# Patient Record
Sex: Male | Born: 1955 | Race: White | Hispanic: No | Marital: Married | Smoking: Current every day smoker
Health system: Southern US, Community
[De-identification: ages and names within clinical notes are randomized; demographics above are authoritative.]

## PROBLEM LIST (undated history)

## (undated) DIAGNOSIS — I10 Essential (primary) hypertension: Secondary | ICD-10-CM

## (undated) DIAGNOSIS — F419 Anxiety disorder, unspecified: Secondary | ICD-10-CM

## (undated) DIAGNOSIS — E119 Type 2 diabetes mellitus without complications: Secondary | ICD-10-CM

## (undated) DIAGNOSIS — J449 Chronic obstructive pulmonary disease, unspecified: Secondary | ICD-10-CM

## (undated) DIAGNOSIS — I82409 Acute embolism and thrombosis of unspecified deep veins of unspecified lower extremity: Secondary | ICD-10-CM

## (undated) DIAGNOSIS — Z951 Presence of aortocoronary bypass graft: Secondary | ICD-10-CM

## (undated) DIAGNOSIS — I2510 Atherosclerotic heart disease of native coronary artery without angina pectoris: Secondary | ICD-10-CM

## (undated) DIAGNOSIS — K269 Duodenal ulcer, unspecified as acute or chronic, without hemorrhage or perforation: Secondary | ICD-10-CM

## (undated) HISTORY — PX: CORONARY ARTERY BYPASS GRAFT: SHX141

---

## 2019-02-11 ENCOUNTER — Inpatient Hospital Stay (HOSPITAL_COMMUNITY): Payer: 59

## 2019-02-11 ENCOUNTER — Other Ambulatory Visit: Payer: Self-pay

## 2019-02-11 ENCOUNTER — Encounter (HOSPITAL_COMMUNITY): Payer: Self-pay | Admitting: Cardiothoracic Surgery

## 2019-02-11 ENCOUNTER — Inpatient Hospital Stay (HOSPITAL_COMMUNITY)
Admission: EM | Admit: 2019-02-11 | Discharge: 2019-02-19 | DRG: 235 | Disposition: A | Discharge status: Home Health | Payer: 59 | Source: Other Acute Inpatient Hospital | Attending: Cardiothoracic Surgery | Admitting: Cardiothoracic Surgery

## 2019-02-11 ENCOUNTER — Inpatient Hospital Stay (HOSPITAL_COMMUNITY)
Admission: EM | Disposition: A | Discharge status: Home Health | Payer: Self-pay | Source: Other Acute Inpatient Hospital | Attending: Cardiothoracic Surgery

## 2019-02-11 ENCOUNTER — Inpatient Hospital Stay (HOSPITAL_COMMUNITY): Payer: 59 | Admitting: Certified Registered Nurse Anesthetist

## 2019-02-11 ENCOUNTER — Emergency Department
Admission: EM | Admit: 2019-02-11 | Discharge: 2019-02-11 | Disposition: A | Discharge status: Other Acute Inpatient Hospital | Payer: 59 | Attending: Emergency Medicine | Admitting: Emergency Medicine

## 2019-02-11 ENCOUNTER — Emergency Department: Payer: 59

## 2019-02-11 DIAGNOSIS — T82898A Other specified complication of vascular prosthetic devices, implants and grafts, initial encounter: Secondary | ICD-10-CM | POA: Diagnosis present

## 2019-02-11 DIAGNOSIS — Z720 Tobacco use: Secondary | ICD-10-CM

## 2019-02-11 DIAGNOSIS — F411 Generalized anxiety disorder: Secondary | ICD-10-CM

## 2019-02-11 DIAGNOSIS — I10 Essential (primary) hypertension: Secondary | ICD-10-CM | POA: Insufficient documentation

## 2019-02-11 DIAGNOSIS — I219 Acute myocardial infarction, unspecified: Secondary | ICD-10-CM

## 2019-02-11 DIAGNOSIS — J449 Chronic obstructive pulmonary disease, unspecified: Secondary | ICD-10-CM

## 2019-02-11 DIAGNOSIS — K269 Duodenal ulcer, unspecified as acute or chronic, without hemorrhage or perforation: Secondary | ICD-10-CM | POA: Diagnosis present

## 2019-02-11 DIAGNOSIS — Z951 Presence of aortocoronary bypass graft: Secondary | ICD-10-CM

## 2019-02-11 DIAGNOSIS — Z91012 Allergy to eggs: Secondary | ICD-10-CM

## 2019-02-11 DIAGNOSIS — Z86718 Personal history of other venous thrombosis and embolism: Secondary | ICD-10-CM

## 2019-02-11 DIAGNOSIS — Z7984 Long term (current) use of oral hypoglycemic drugs: Secondary | ICD-10-CM

## 2019-02-11 DIAGNOSIS — J9622 Acute and chronic respiratory failure with hypercapnia: Secondary | ICD-10-CM | POA: Diagnosis not present

## 2019-02-11 DIAGNOSIS — I253 Aneurysm of heart: Secondary | ICD-10-CM | POA: Diagnosis present

## 2019-02-11 DIAGNOSIS — E876 Hypokalemia: Secondary | ICD-10-CM | POA: Diagnosis not present

## 2019-02-11 DIAGNOSIS — J9811 Atelectasis: Secondary | ICD-10-CM | POA: Diagnosis not present

## 2019-02-11 DIAGNOSIS — T82897A Other specified complication of cardiac prosthetic devices, implants and grafts, initial encounter: Secondary | ICD-10-CM

## 2019-02-11 DIAGNOSIS — R072 Precordial pain: Secondary | ICD-10-CM | POA: Insufficient documentation

## 2019-02-11 DIAGNOSIS — Z79899 Other long term (current) drug therapy: Secondary | ICD-10-CM | POA: Diagnosis not present

## 2019-02-11 DIAGNOSIS — I82409 Acute embolism and thrombosis of unspecified deep veins of unspecified lower extremity: Secondary | ICD-10-CM

## 2019-02-11 DIAGNOSIS — Z91011 Allergy to milk products: Secondary | ICD-10-CM

## 2019-02-11 DIAGNOSIS — E119 Type 2 diabetes mellitus without complications: Secondary | ICD-10-CM | POA: Diagnosis present

## 2019-02-11 DIAGNOSIS — I772 Rupture of artery: Secondary | ICD-10-CM | POA: Insufficient documentation

## 2019-02-11 DIAGNOSIS — Z09 Encounter for follow-up examination after completed treatment for conditions other than malignant neoplasm: Secondary | ICD-10-CM

## 2019-02-11 DIAGNOSIS — E669 Obesity, unspecified: Secondary | ICD-10-CM | POA: Diagnosis present

## 2019-02-11 DIAGNOSIS — L03313 Cellulitis of chest wall: Secondary | ICD-10-CM | POA: Diagnosis not present

## 2019-02-11 DIAGNOSIS — R0789 Other chest pain: Secondary | ICD-10-CM | POA: Diagnosis present

## 2019-02-11 DIAGNOSIS — Z880 Allergy status to penicillin: Secondary | ICD-10-CM | POA: Diagnosis not present

## 2019-02-11 DIAGNOSIS — Z6834 Body mass index (BMI) 34.0-34.9, adult: Secondary | ICD-10-CM | POA: Diagnosis not present

## 2019-02-11 DIAGNOSIS — R079 Chest pain, unspecified: Secondary | ICD-10-CM

## 2019-02-11 DIAGNOSIS — Z20822 Contact with and (suspected) exposure to covid-19: Secondary | ICD-10-CM | POA: Insufficient documentation

## 2019-02-11 DIAGNOSIS — E8770 Fluid overload, unspecified: Secondary | ICD-10-CM | POA: Diagnosis not present

## 2019-02-11 DIAGNOSIS — T8149XA Infection following a procedure, other surgical site, initial encounter: Secondary | ICD-10-CM | POA: Diagnosis not present

## 2019-02-11 DIAGNOSIS — K219 Gastro-esophageal reflux disease without esophagitis: Secondary | ICD-10-CM | POA: Diagnosis present

## 2019-02-11 DIAGNOSIS — Z95828 Presence of other vascular implants and grafts: Secondary | ICD-10-CM

## 2019-02-11 DIAGNOSIS — F17210 Nicotine dependence, cigarettes, uncomplicated: Secondary | ICD-10-CM | POA: Diagnosis present

## 2019-02-11 DIAGNOSIS — I4891 Unspecified atrial fibrillation: Secondary | ICD-10-CM | POA: Diagnosis not present

## 2019-02-11 DIAGNOSIS — I714 Abdominal aortic aneurysm, without rupture: Secondary | ICD-10-CM

## 2019-02-11 DIAGNOSIS — I2510 Atherosclerotic heart disease of native coronary artery without angina pectoris: Secondary | ICD-10-CM

## 2019-02-11 DIAGNOSIS — Z419 Encounter for procedure for purposes other than remedying health state, unspecified: Secondary | ICD-10-CM

## 2019-02-11 HISTORY — DX: Duodenal ulcer, unspecified as acute or chronic, without hemorrhage or perforation: K26.9

## 2019-02-11 HISTORY — DX: Presence of aortocoronary bypass graft: Z95.1

## 2019-02-11 HISTORY — DX: Type 2 diabetes mellitus without complications: E11.9

## 2019-02-11 HISTORY — DX: Essential (primary) hypertension: I10

## 2019-02-11 HISTORY — DX: Anxiety disorder, unspecified: F41.9

## 2019-02-11 HISTORY — DX: Chronic obstructive pulmonary disease, unspecified: J44.9

## 2019-02-11 HISTORY — DX: Acute embolism and thrombosis of unspecified deep veins of unspecified lower extremity: I82.409

## 2019-02-11 HISTORY — PX: THORACIC AORTIC ANEURYSM REPAIR: SHX799

## 2019-02-11 HISTORY — PX: TEE WITHOUT CARDIOVERSION: SHX5443

## 2019-02-11 HISTORY — DX: Atherosclerotic heart disease of native coronary artery without angina pectoris: I25.10

## 2019-02-11 HISTORY — PX: CORONARY ARTERY BYPASS GRAFT: SHX141

## 2019-02-11 LAB — PROTIME-INR
INR: 0.9 (ref 0.8–1.2)
Prothrombin Time: 11.9 seconds (ref 11.4–15.2)

## 2019-02-11 LAB — CBC WITH DIFFERENTIAL/PLATELET
Abs Immature Granulocytes: 0.21 10*3/uL — ABNORMAL HIGH (ref 0.00–0.07)
Basophils Absolute: 0.1 10*3/uL (ref 0.0–0.1)
Basophils Relative: 1 %
Eosinophils Absolute: 0.4 10*3/uL (ref 0.0–0.5)
Eosinophils Relative: 3 %
HCT: 47.6 % (ref 39.0–52.0)
Hemoglobin: 15.3 g/dL (ref 13.0–17.0)
Immature Granulocytes: 2 %
Lymphocytes Relative: 29 %
Lymphs Abs: 3.3 10*3/uL (ref 0.7–4.0)
MCH: 30.7 pg (ref 26.0–34.0)
MCHC: 32.1 g/dL (ref 30.0–36.0)
MCV: 95.6 fL (ref 80.0–100.0)
Monocytes Absolute: 1 10*3/uL (ref 0.1–1.0)
Monocytes Relative: 9 %
Neutro Abs: 6.3 10*3/uL (ref 1.7–7.7)
Neutrophils Relative %: 56 %
Platelets: 256 10*3/uL (ref 150–400)
RBC: 4.98 MIL/uL (ref 4.22–5.81)
RDW: 13 % (ref 11.5–15.5)
WBC: 11.4 10*3/uL — ABNORMAL HIGH (ref 4.0–10.5)
nRBC: 0 % (ref 0.0–0.2)

## 2019-02-11 LAB — COMPREHENSIVE METABOLIC PANEL
ALT: 24 U/L (ref 0–44)
AST: 25 U/L (ref 15–41)
Albumin: 4.4 g/dL (ref 3.5–5.0)
Alkaline Phosphatase: 72 U/L (ref 38–126)
Anion gap: 13 (ref 5–15)
BUN: 14 mg/dL (ref 8–23)
CO2: 24 mmol/L (ref 22–32)
Calcium: 8.8 mg/dL — ABNORMAL LOW (ref 8.9–10.3)
Chloride: 104 mmol/L (ref 98–111)
Creatinine, Ser: 1.08 mg/dL (ref 0.61–1.24)
GFR calc Af Amer: >60 mL/min (ref >60)
GFR calc non Af Amer: >60 mL/min (ref >60)
Glucose, Bld: 238 mg/dL — ABNORMAL HIGH (ref 70–99)
Potassium: 4.5 mmol/L (ref 3.5–5.1)
Sodium: 141 mmol/L (ref 135–145)
Total Bilirubin: 0.6 mg/dL (ref 0.3–1.2)
Total Protein: 7.7 g/dL (ref 6.5–8.1)

## 2019-02-11 LAB — GLUCOSE, CAPILLARY
Glucose-Capillary: 174 mg/dL — ABNORMAL HIGH (ref 70–99)
Glucose-Capillary: 136 mg/dL — ABNORMAL HIGH (ref 70–99)

## 2019-02-11 LAB — SURGICAL PCR SCREEN
MRSA, PCR: NEGATIVE
Staphylococcus aureus: POSITIVE — AB

## 2019-02-11 LAB — PREPARE RBC (CROSSMATCH)

## 2019-02-11 LAB — TROPONIN I (HIGH SENSITIVITY)
Troponin I (High Sensitivity): 21 ng/L — ABNORMAL HIGH (ref <18)
Troponin I (High Sensitivity): 210 ng/L (ref <18)

## 2019-02-11 LAB — RESPIRATORY PANEL BY RT PCR (FLU A&B, COVID)
Influenza A by PCR: NEGATIVE
Influenza B by PCR: NEGATIVE
SARS Coronavirus 2 by RT PCR: NEGATIVE

## 2019-02-11 LAB — ABO/RH: ABO/RH(D): A POS

## 2019-02-11 LAB — APTT: aPTT: 28 seconds (ref 24–36)

## 2019-02-11 SURGERY — REDO STERNOTOMY
Anesthesia: General | Site: Chest

## 2019-02-11 MED ORDER — NOREPINEPHRINE 4 MG/250ML-% IV SOLN
0.0000 ug/min | INTRAVENOUS | Status: AC
  Administered 2019-02-12: 01:00:00 20 ug/min via INTRAVENOUS
  Filled 2019-02-11: qty 250

## 2019-02-11 MED ORDER — HEMOSTATIC AGENTS (NO CHARGE) OPTIME
TOPICAL | Status: DC | PRN
  Administered 2019-02-11 (×5): 1 via TOPICAL

## 2019-02-11 MED ORDER — LIDOCAINE 2% (20 MG/ML) 5 ML SYRINGE
INTRAMUSCULAR | Status: DC | PRN
  Administered 2019-02-11: 60 mg via INTRAVENOUS

## 2019-02-11 MED ORDER — SODIUM CHLORIDE 0.9 % IV BOLUS
500.0000 mL | Freq: Once | INTRAVENOUS | Status: AC
  Administered 2019-02-11: 500 mL via INTRAVENOUS

## 2019-02-11 MED ORDER — DEXMEDETOMIDINE HCL IN NACL 400 MCG/100ML IV SOLN
0.1000 ug/kg/h | INTRAVENOUS | Status: DC
  Filled 2019-02-11: qty 100

## 2019-02-11 MED ORDER — IOHEXOL 350 MG/ML SOLN: 125.0000 mL | Freq: Once | INTRAVENOUS | Status: DC | PRN

## 2019-02-11 MED ORDER — NITROGLYCERIN 0.4 MG SL SUBL
0.4000 mg | SUBLINGUAL_TABLET | SUBLINGUAL | Status: DC | PRN
  Administered 2019-02-11 (×3): 0.4 mg via SUBLINGUAL
  Filled 2019-02-11: qty 1

## 2019-02-11 MED ORDER — INSULIN REGULAR(HUMAN) IN NACL 100-0.9 UT/100ML-% IV SOLN
INTRAVENOUS | Status: DC
  Filled 2019-02-11 (×2): qty 100

## 2019-02-11 MED ORDER — PROPOFOL 10 MG/ML IV BOLUS
INTRAVENOUS | Status: DC | PRN
  Administered 2019-02-11: 50 mg via INTRAVENOUS

## 2019-02-11 MED ORDER — TRANEXAMIC ACID 1000 MG/10ML IV SOLN
1.5000 mg/kg/h | INTRAVENOUS | Status: DC
  Filled 2019-02-11 (×2): qty 25

## 2019-02-11 MED ORDER — NITROGLYCERIN IN D5W 200-5 MCG/ML-% IV SOLN
INTRAVENOUS | Status: AC
  Filled 2019-02-11: qty 250

## 2019-02-11 MED ORDER — FENTANYL CITRATE (PF) 250 MCG/5ML IJ SOLN
INTRAMUSCULAR | Status: AC
  Filled 2019-02-11: qty 10

## 2019-02-11 MED ORDER — FENTANYL CITRATE (PF) 100 MCG/2ML IJ SOLN
50.0000 ug | Freq: Once | INTRAMUSCULAR | Status: AC
  Administered 2019-02-11: 50 ug via INTRAVENOUS
  Filled 2019-02-11: qty 2

## 2019-02-11 MED ORDER — TRANEXAMIC ACID (OHS) PUMP PRIME SOLUTION
2.0000 mg/kg | INTRAVENOUS | Status: DC
  Filled 2019-02-11: qty 2.3

## 2019-02-11 MED ORDER — PROTAMINE SULFATE 10 MG/ML IV SOLN
40.0000 mg | Freq: Once | INTRAVENOUS | Status: AC
  Administered 2019-02-11: 40 mg via INTRAVENOUS
  Filled 2019-02-11: qty 4

## 2019-02-11 MED ORDER — INSULIN REGULAR(HUMAN) IN NACL 100-0.9 UT/100ML-% IV SOLN
INTRAVENOUS | Status: DC | PRN
  Administered 2019-02-11: 7.5 [IU]/h via INTRAVENOUS

## 2019-02-11 MED ORDER — PHENYLEPHRINE 40 MCG/ML (10ML) SYRINGE FOR IV PUSH (FOR BLOOD PRESSURE SUPPORT)
PREFILLED_SYRINGE | INTRAVENOUS | Status: AC
  Filled 2019-02-11: qty 10

## 2019-02-11 MED ORDER — MIDAZOLAM HCL 2 MG/2ML IJ SOLN
INTRAMUSCULAR | Status: AC
  Filled 2019-02-11: qty 2

## 2019-02-11 MED ORDER — DEXTROSE 50 % IV SOLN
INTRAVENOUS | Status: DC | PRN
  Administered 2019-02-11 – 2019-02-12 (×2): 25 g via INTRAVENOUS

## 2019-02-11 MED ORDER — VANCOMYCIN HCL 1500 MG/300ML IV SOLN
1500.0000 mg | INTRAVENOUS | Status: AC
  Administered 2019-02-11: 1500 mg via INTRAVENOUS
  Filled 2019-02-11: qty 300

## 2019-02-11 MED ORDER — TRANEXAMIC ACID (OHS) BOLUS VIA INFUSION
15.0000 mg/kg | INTRAVENOUS | Status: AC
  Administered 2019-02-11: 1722 mg via INTRAVENOUS
  Filled 2019-02-11: qty 1722

## 2019-02-11 MED ORDER — 0.9 % SODIUM CHLORIDE (POUR BTL) OPTIME
TOPICAL | Status: DC | PRN
  Administered 2019-02-11: 18:00:00 5000 mL

## 2019-02-11 MED ORDER — SODIUM CHLORIDE 0.9 % IV SOLN
1.5000 g | INTRAVENOUS | Status: AC
  Administered 2019-02-11: 1.5 g via INTRAVENOUS
  Administered 2019-02-12: 02:00:00 .75 g via INTRAVENOUS
  Filled 2019-02-11: qty 1.5

## 2019-02-11 MED ORDER — EPHEDRINE SULFATE 50 MG/ML IJ SOLN
INTRAMUSCULAR | Status: DC | PRN
  Administered 2019-02-11 (×3): 5 mg via INTRAVENOUS
  Administered 2019-02-11 (×2): 10 mg via INTRAVENOUS

## 2019-02-11 MED ORDER — HEPARIN SODIUM (PORCINE) 1000 UNIT/ML IJ SOLN
INTRAMUSCULAR | Status: DC | PRN
  Administered 2019-02-11: 31000 [IU] via INTRAVENOUS
  Administered 2019-02-11: 3000 [IU] via INTRAVENOUS
  Administered 2019-02-11: 2000 [IU] via INTRAVENOUS

## 2019-02-11 MED ORDER — MILRINONE LACTATE IN DEXTROSE 20-5 MG/100ML-% IV SOLN
0.3000 ug/kg/min | INTRAVENOUS | Status: DC
  Filled 2019-02-11: qty 100

## 2019-02-11 MED ORDER — FENTANYL CITRATE (PF) 250 MCG/5ML IJ SOLN
INTRAMUSCULAR | Status: DC | PRN
  Administered 2019-02-11: 100 ug via INTRAVENOUS
  Administered 2019-02-11: 50 ug via INTRAVENOUS
  Administered 2019-02-11: 100 ug via INTRAVENOUS
  Administered 2019-02-11 – 2019-02-12 (×7): 250 ug via INTRAVENOUS

## 2019-02-11 MED ORDER — LIDOCAINE 2% (20 MG/ML) 5 ML SYRINGE
INTRAMUSCULAR | Status: AC
  Filled 2019-02-11: qty 5

## 2019-02-11 MED ORDER — ROCURONIUM BROMIDE 10 MG/ML (PF) SYRINGE
PREFILLED_SYRINGE | INTRAVENOUS | Status: AC
  Filled 2019-02-11: qty 10

## 2019-02-11 MED ORDER — ROCURONIUM BROMIDE 10 MG/ML (PF) SYRINGE
PREFILLED_SYRINGE | INTRAVENOUS | Status: DC | PRN
  Administered 2019-02-11 (×2): 100 mg via INTRAVENOUS
  Administered 2019-02-12 (×2): 50 mg via INTRAVENOUS

## 2019-02-11 MED ORDER — CHLORHEXIDINE GLUCONATE 0.12 % MT SOLN
15.0000 mL | Freq: Once | OROMUCOSAL | Status: AC
  Administered 2019-02-11: 15 mL via OROMUCOSAL
  Filled 2019-02-11: qty 15

## 2019-02-11 MED ORDER — LACTATED RINGERS IV SOLN: INTRAVENOUS | Status: DC

## 2019-02-11 MED ORDER — PHENYLEPHRINE 40 MCG/ML (10ML) SYRINGE FOR IV PUSH (FOR BLOOD PRESSURE SUPPORT)
PREFILLED_SYRINGE | INTRAVENOUS | Status: DC | PRN
  Administered 2019-02-11: 120 ug via INTRAVENOUS
  Administered 2019-02-11: 80 ug via INTRAVENOUS
  Administered 2019-02-11 (×4): 120 ug via INTRAVENOUS
  Administered 2019-02-11: 40 ug via INTRAVENOUS
  Administered 2019-02-11: 120 ug via INTRAVENOUS

## 2019-02-11 MED ORDER — POTASSIUM CHLORIDE 2 MEQ/ML IV SOLN
80.0000 meq | INTRAVENOUS | Status: DC
  Filled 2019-02-11: qty 40

## 2019-02-11 MED ORDER — METOPROLOL TARTRATE 12.5 MG HALF TABLET
12.5000 mg | ORAL_TABLET | Freq: Once | ORAL | Status: AC
  Administered 2019-02-11: 12.5 mg via ORAL
  Filled 2019-02-11: qty 1

## 2019-02-11 MED ORDER — EPINEPHRINE PF 1 MG/ML IJ SOLN
INTRAMUSCULAR | Status: DC | PRN
  Administered 2019-02-11: .05 mg via INTRAVENOUS

## 2019-02-11 MED ORDER — LACTATED RINGERS IV SOLN: INTRAVENOUS | Status: DC | PRN

## 2019-02-11 MED ORDER — MAGNESIUM SULFATE 50 % IJ SOLN
40.0000 meq | INTRAMUSCULAR | Status: DC
  Filled 2019-02-11: qty 9.85

## 2019-02-11 MED ORDER — NITROGLYCERIN IN D5W 200-5 MCG/ML-% IV SOLN
INTRAVENOUS | Status: DC | PRN
  Administered 2019-02-11: 10 ug/min via INTRAVENOUS

## 2019-02-11 MED ORDER — NITROGLYCERIN IN D5W 200-5 MCG/ML-% IV SOLN
2.0000 ug/min | INTRAVENOUS | Status: AC
  Administered 2019-02-11: 10 ug/min via INTRAVENOUS
  Filled 2019-02-11: qty 250

## 2019-02-11 MED ORDER — PHENYLEPHRINE HCL-NACL 20-0.9 MG/250ML-% IV SOLN
INTRAVENOUS | Status: DC | PRN
  Administered 2019-02-11: 50 ug/min via INTRAVENOUS

## 2019-02-11 MED ORDER — TEMAZEPAM 15 MG PO CAPS: 15.0000 mg | ORAL_CAPSULE | Freq: Once | ORAL | Status: DC | PRN

## 2019-02-11 MED ORDER — MIDAZOLAM HCL 5 MG/5ML IJ SOLN
INTRAMUSCULAR | Status: DC | PRN
  Administered 2019-02-11 (×2): 2 mg via INTRAVENOUS
  Administered 2019-02-12: 5 mg via INTRAVENOUS

## 2019-02-11 MED ORDER — VANCOMYCIN HCL 1000 MG IV SOLR
INTRAVENOUS | Status: AC
  Administered 2019-02-12: 01:00:00 1000 mL
  Filled 2019-02-11: qty 1000

## 2019-02-11 MED ORDER — PROPOFOL 10 MG/ML IV BOLUS
INTRAVENOUS | Status: AC
  Filled 2019-02-11: qty 20

## 2019-02-11 MED ORDER — TRANEXAMIC ACID 1000 MG/10ML IV SOLN
INTRAVENOUS | Status: DC | PRN
  Administered 2019-02-11: 1.5 mg/kg/h via INTRAVENOUS

## 2019-02-11 MED ORDER — SODIUM CHLORIDE 0.9 % IV SOLN
750.0000 mg | INTRAVENOUS | Status: DC
  Filled 2019-02-11: qty 750

## 2019-02-11 MED ORDER — HEPARIN BOLUS VIA INFUSION
4000.0000 [IU] | Freq: Once | INTRAVENOUS | Status: AC
  Administered 2019-02-11: 4000 [IU] via INTRAVENOUS
  Filled 2019-02-11: qty 4000

## 2019-02-11 MED ORDER — SODIUM BICARBONATE 8.4 % IV SOLN
INTRAVENOUS | Status: DC | PRN
  Administered 2019-02-11 (×2): 50 meq via INTRAVENOUS

## 2019-02-11 MED ORDER — EPINEPHRINE PF 1 MG/ML IJ SOLN
0.0000 ug/min | INTRAVENOUS | Status: DC
  Filled 2019-02-11: qty 4

## 2019-02-11 MED ORDER — IOHEXOL 350 MG/ML SOLN
100.0000 mL | Freq: Once | INTRAVENOUS | Status: AC | PRN
  Administered 2019-02-11: 100 mL via INTRAVENOUS

## 2019-02-11 MED ORDER — BISACODYL 5 MG PO TBEC
5.0000 mg | DELAYED_RELEASE_TABLET | Freq: Once | ORAL | Status: DC
  Filled 2019-02-11: qty 1

## 2019-02-11 MED ORDER — DOBUTAMINE IN D5W 4-5 MG/ML-% IV SOLN
2.5000 ug/kg/min | INTRAVENOUS | Status: DC
  Filled 2019-02-11: qty 250

## 2019-02-11 MED ORDER — IOHEXOL 300 MG/ML  SOLN: 100.0000 mL | Freq: Once | INTRAMUSCULAR | Status: DC | PRN

## 2019-02-11 MED ORDER — FENTANYL CITRATE (PF) 250 MCG/5ML IJ SOLN
INTRAMUSCULAR | Status: AC
  Filled 2019-02-11: qty 20

## 2019-02-11 MED ORDER — PHENYLEPHRINE HCL-NACL 20-0.9 MG/250ML-% IV SOLN
30.0000 ug/min | INTRAVENOUS | Status: DC
  Filled 2019-02-11: qty 250

## 2019-02-11 MED ORDER — HEPARIN (PORCINE) 25000 UT/250ML-% IV SOLN
1400.0000 [IU]/h | INTRAVENOUS | Status: DC
  Administered 2019-02-11: 1400 [IU]/h via INTRAVENOUS
  Filled 2019-02-11: qty 250

## 2019-02-11 MED ORDER — PLASMA-LYTE 148 IV SOLN
INTRAVENOUS | Status: AC
  Administered 2019-02-11: 500 mL
  Filled 2019-02-11: qty 2.5

## 2019-02-11 MED ORDER — SODIUM CHLORIDE 0.9 % IV SOLN
INTRAVENOUS | Status: DC
  Filled 2019-02-11: qty 30

## 2019-02-11 MED ORDER — CHLORHEXIDINE GLUCONATE CLOTH 2 % EX PADS: 6.0000 | MEDICATED_PAD | Freq: Once | CUTANEOUS | Status: DC

## 2019-02-11 MED ORDER — SUCCINYLCHOLINE CHLORIDE 20 MG/ML IJ SOLN
INTRAMUSCULAR | Status: DC | PRN
  Administered 2019-02-11: 120 mg via INTRAVENOUS

## 2019-02-11 SURGICAL SUPPLY — 144 items
ADAPTER CARDIO PERF ANTE/RETRO (ADAPTER) ×4 IMPLANT
APPLICATOR TIP COSEAL (VASCULAR PRODUCTS) ×2 IMPLANT
BAG DECANTER FOR FLEXI CONT (MISCELLANEOUS) ×8 IMPLANT
BLADE CORE FAN STRYKER (BLADE) ×6 IMPLANT
BLADE STERNUM SYSTEM 6 (BLADE) ×2 IMPLANT
BLADE SURG 12 STRL SS (BLADE) ×4 IMPLANT
BLADE SURG 15 STRL LF DISP TIS (BLADE) ×2 IMPLANT
BLADE SURG 15 STRL SS (BLADE) ×2
BLOOD HAEMOCONCENTR 700 MIDI (MISCELLANEOUS) ×2 IMPLANT
BNDG ELASTIC 4X5.8 VLCR STR LF (GAUZE/BANDAGES/DRESSINGS) ×4 IMPLANT
BNDG ELASTIC 6X5.8 VLCR STR LF (GAUZE/BANDAGES/DRESSINGS) ×4 IMPLANT
BNDG GAUZE ELAST 4 BULKY (GAUZE/BANDAGES/DRESSINGS) ×4 IMPLANT
CANISTER SUCT 3000ML PPV (MISCELLANEOUS) ×6 IMPLANT
CANN PRFSN 3/8X14X24FR PCFC (MISCELLANEOUS) ×2
CANN PRFSN 3/8XRT ANG TPR 14 (MISCELLANEOUS) ×2
CANNULA GUNDRY RCSP 15FR (MISCELLANEOUS) ×6 IMPLANT
CANNULA PRFSN 3/8X14X24FR PCFC (MISCELLANEOUS) IMPLANT
CANNULA PRFSN 3/8XRT ANG TPR14 (MISCELLANEOUS) IMPLANT
CANNULA SNGLE STGE 40FR VENOUS (CANNULA) ×2 IMPLANT
CANNULA VEN MTL TIP RT (MISCELLANEOUS) ×4
CATH HEART VENT LEFT (CATHETERS) ×2 IMPLANT
CATH RETROPLEGIA CORONARY 14FR (CATHETERS) ×2 IMPLANT
CATH ROBINSON RED A/P 18FR (CATHETERS) ×6 IMPLANT
CATH THORACIC 28FR RT ANG (CATHETERS) ×8 IMPLANT
CLIP VESOCCLUDE MED 24/CT (CLIP) IMPLANT
CONN 1/2X3/8X3/8 Y GISH (MISCELLANEOUS) ×2 IMPLANT
CONN 3/8X3/8 GISH STERILE (MISCELLANEOUS) ×4 IMPLANT
CONN ST 1/4X3/8  BEN (MISCELLANEOUS) ×6
CONN ST 1/4X3/8 BEN (MISCELLANEOUS) IMPLANT
CONN Y 3/8X3/8X3/8  BEN (MISCELLANEOUS) ×2
CONN Y 3/8X3/8X3/8 BEN (MISCELLANEOUS) ×2 IMPLANT
CONT SPEC 4OZ CLIKSEAL STRL BL (MISCELLANEOUS) ×2 IMPLANT
COVER MAYO STAND STRL (DRAPES) ×2 IMPLANT
COVER PROBE W GEL 5X96 (DRAPES) ×2 IMPLANT
DRAIN CHANNEL 32F RND 10.7 FF (WOUND CARE) ×2 IMPLANT
DRAPE CARDIOVASCULAR INCISE (DRAPES) ×2
DRAPE SLUSH/WARMER DISC (DRAPES) ×2 IMPLANT
DRAPE SRG 135X102X78XABS (DRAPES) ×2 IMPLANT
DRSG AQUACEL AG ADV 3.5X 6 (GAUZE/BANDAGES/DRESSINGS) ×2 IMPLANT
DRSG AQUACEL AG ADV 3.5X14 (GAUZE/BANDAGES/DRESSINGS) ×2 IMPLANT
ELECT BLADE 6.5 EXT (BLADE) ×4 IMPLANT
ELECT CAUTERY BLADE 6.4 (BLADE) ×2 IMPLANT
ELECT REM PT RETURN 9FT ADLT (ELECTROSURGICAL) ×8
ELECTRODE REM PT RTRN 9FT ADLT (ELECTROSURGICAL) ×8 IMPLANT
FELT TEFLON 1X6 (MISCELLANEOUS) ×4 IMPLANT
FEMORAL VENOUS CANN RAP (CANNULA) ×2 IMPLANT
GAUZE SPONGE 4X4 12PLY STRL (GAUZE/BANDAGES/DRESSINGS) ×14 IMPLANT
GLOVE BIO SURGEON STRL SZ 6 (GLOVE) ×4 IMPLANT
GLOVE BIO SURGEON STRL SZ 6.5 (GLOVE) ×5 IMPLANT
GLOVE BIO SURGEON STRL SZ7 (GLOVE) ×4 IMPLANT
GLOVE BIO SURGEON STRL SZ7.5 (GLOVE) ×22 IMPLANT
GLOVE BIO SURGEONS STRL SZ 6.5 (GLOVE) ×3
GLOVE BIOGEL PI IND STRL 6 (GLOVE) ×2 IMPLANT
GLOVE BIOGEL PI IND STRL 6.5 (GLOVE) ×2 IMPLANT
GLOVE BIOGEL PI IND STRL 7.0 (GLOVE) ×2 IMPLANT
GLOVE BIOGEL PI INDICATOR 6 (GLOVE) ×2
GLOVE BIOGEL PI INDICATOR 6.5 (GLOVE) ×4
GLOVE BIOGEL PI INDICATOR 7.0 (GLOVE) ×2
GLOVE EUDERMIC 7 POWDERFREE (GLOVE) ×4 IMPLANT
GOWN STRL REUS W/ TWL LRG LVL3 (GOWN DISPOSABLE) ×16 IMPLANT
GOWN STRL REUS W/ TWL XL LVL3 (GOWN DISPOSABLE) IMPLANT
GOWN STRL REUS W/TWL LRG LVL3 (GOWN DISPOSABLE) ×16
GOWN STRL REUS W/TWL XL LVL3 (GOWN DISPOSABLE) ×6
GRAFT CV 30X8WVN NDL (Graft) IMPLANT
GRAFT HEMASHIELD 8MM (Graft) ×2 IMPLANT
HANDLE STAPLE ENDO GIA SHORT (STAPLE) ×1
HEMOSTAT POWDER SURGIFOAM 1G (HEMOSTASIS) ×12 IMPLANT
HEMOSTAT SURGICEL 2X14 (HEMOSTASIS) ×4 IMPLANT
INSERT FOGARTY 61MM (MISCELLANEOUS) ×4 IMPLANT
KIT BASIN OR (CUSTOM PROCEDURE TRAY) ×6 IMPLANT
KIT DILATOR VASC 18G NDL (KITS) ×2 IMPLANT
KIT DRAINAGE VACCUM ASSIST (KITS) ×2 IMPLANT
KIT SUCTION CATH 14FR (SUCTIONS) ×4 IMPLANT
KIT TURNOVER KIT B (KITS) ×6 IMPLANT
KIT VASOVIEW HEMOPRO 2 VH 4000 (KITS) ×4 IMPLANT
LINE VENT (MISCELLANEOUS) ×4 IMPLANT
LOOP VESSEL MAXI BLUE (MISCELLANEOUS) ×2 IMPLANT
MARKER GRAFT CORONARY BYPASS (MISCELLANEOUS) ×4 IMPLANT
NDL AORTIC AIR ASPIRATING (NEEDLE) IMPLANT
NEEDLE AORTIC AIR ASPIRATING (NEEDLE) IMPLANT
NS IRRIG 1000ML POUR BTL (IV SOLUTION) ×10 IMPLANT
PACK E OPEN HEART (SUTURE) ×6 IMPLANT
PACK OPEN HEART (CUSTOM PROCEDURE TRAY) ×6 IMPLANT
PAD ARMBOARD 7.5X6 YLW CONV (MISCELLANEOUS) ×12 IMPLANT
PAD DEFIB R2 (MISCELLANEOUS) ×4 IMPLANT
PAD ELECT DEFIB RADIOL ZOLL (MISCELLANEOUS) ×4 IMPLANT
PENCIL BUTTON HOLSTER BLD 10FT (ELECTRODE) ×2 IMPLANT
POSITIONER HEAD DONUT 9IN (MISCELLANEOUS) ×4 IMPLANT
POWDER SURGICEL 3.0 GRAM (HEMOSTASIS) ×4 IMPLANT
PUNCH AORTIC ROTATE 4.0MM (MISCELLANEOUS) IMPLANT
PUNCH AORTIC ROTATE 4.5MM 8IN (MISCELLANEOUS) ×2 IMPLANT
RELOAD TRI 2.0 30 VAS MED SUL (STAPLE) ×2 IMPLANT
SEALANT SURG COSEAL 4ML (VASCULAR PRODUCTS) ×2 IMPLANT
SEALANT SURG COSEAL 8ML (VASCULAR PRODUCTS) ×2 IMPLANT
SENSOR MYOCARDIAL TEMP (MISCELLANEOUS) ×2 IMPLANT
SET CARDIOPLEGIA MPS 5001102 (MISCELLANEOUS) ×2 IMPLANT
SIZER VASCULAR GRAFT LG 24-38 (SIZER) ×2 IMPLANT
SOL ANTI FOG 6CC (MISCELLANEOUS) ×2 IMPLANT
SOLUTION ANTI FOG 6CC (MISCELLANEOUS) ×2
SPONGE LAP 18X18 RF (DISPOSABLE) ×2 IMPLANT
SPONGE LAP 18X18 X RAY DECT (DISPOSABLE) ×2 IMPLANT
SPONGE LAP 4X18 RFD (DISPOSABLE) ×4 IMPLANT
STAPLER ENDO GIA 12 SHRT THIN (STAPLE) IMPLANT
STAPLER ENDO GIA 12MM SHORT (STAPLE) ×3 IMPLANT
STAPLER VISISTAT 35W (STAPLE) ×2 IMPLANT
STOPCOCK 4 WAY LG BORE MALE ST (IV SETS) ×4 IMPLANT
SURGIFLO W/THROMBIN 8M KIT (HEMOSTASIS) ×2 IMPLANT
SUT BONE WAX W31G (SUTURE) ×4 IMPLANT
SUT ETHIBOND 2 0 SH (SUTURE) ×2
SUT ETHIBOND 2 0 SH 36X2 (SUTURE) IMPLANT
SUT PROLENE 3 0 RB 1 (SUTURE) ×4 IMPLANT
SUT PROLENE 3 0 SH DA (SUTURE) ×16 IMPLANT
SUT PROLENE 4 0 RB 1 (SUTURE) ×6
SUT PROLENE 4 0 SH DA (SUTURE) ×6 IMPLANT
SUT PROLENE 4-0 RB1 .5 CRCL 36 (SUTURE) IMPLANT
SUT PROLENE 5 0 C 1 36 (SUTURE) ×6 IMPLANT
SUT PROLENE 6 0 C 1 30 (SUTURE) ×2 IMPLANT
SUT PROLENE 6 0 CC (SUTURE) ×12 IMPLANT
SUT PROLENE 7 0 BV1 MDA (SUTURE) ×4 IMPLANT
SUT SILK  1 MH (SUTURE)
SUT SILK 1 MH (SUTURE) ×2 IMPLANT
SUT SILK 2 0 SH CR/8 (SUTURE) ×2 IMPLANT
SUT STEEL 6MS V (SUTURE) ×2 IMPLANT
SUT STEEL SZ 6 DBL 3X14 BALL (SUTURE) ×4 IMPLANT
SUT VIC AB 1 CTX 18 (SUTURE) ×4 IMPLANT
SUT VIC AB 1 CTX 27 (SUTURE) ×8 IMPLANT
SUT VIC AB 1 CTX 36 (SUTURE)
SUT VIC AB 1 CTX36XBRD ANBCTR (SUTURE) IMPLANT
SUT VIC AB 2-0 CT1 27 (SUTURE) ×4
SUT VIC AB 2-0 CT1 TAPERPNT 27 (SUTURE) IMPLANT
SUT VIC AB 3-0 X1 27 (SUTURE) ×2 IMPLANT
SYSTEM SAHARA CHEST DRAIN ATS (WOUND CARE) ×6 IMPLANT
TAPE CLOTH SOFT 2X10 (GAUZE/BANDAGES/DRESSINGS) ×2 IMPLANT
TAPE CLOTH SURG 4X10 WHT LF (GAUZE/BANDAGES/DRESSINGS) ×2 IMPLANT
TOWEL GREEN STERILE (TOWEL DISPOSABLE) ×6 IMPLANT
TOWEL GREEN STERILE FF (TOWEL DISPOSABLE) ×6 IMPLANT
TRAY CATH LUMEN 1 20CM STRL (SET/KITS/TRAYS/PACK) ×6 IMPLANT
TRAY FOLEY SLVR 16FR TEMP STAT (SET/KITS/TRAYS/PACK) ×4 IMPLANT
TUBING ART PRESS 48 MALE/FEM (TUBING) ×8 IMPLANT
TUBING LAP HI FLOW INSUFFLATIO (TUBING) ×4 IMPLANT
UNDERPAD 30X30 (UNDERPADS AND DIAPERS) ×4 IMPLANT
VENT LEFT HEART 12002 (CATHETERS) ×4
WATER STERILE IRR 1000ML POUR (IV SOLUTION) ×12 IMPLANT
YANKAUER SUCT BULB TIP NO VENT (SUCTIONS) ×2 IMPLANT

## 2019-02-11 NOTE — ED Notes (Signed)
Unable to provide any medical hx due to intense pain per pt

## 2019-02-11 NOTE — ED Triage Notes (Addendum)
Pt [place] [facility] [place] after having some central chest pain that radiates to right arm.  Pt states he was driving down the road and the pain started. Pt states he pulled over and called 911.  EMS reports pt has hx of CABG. Pt states he didn't take his morning medications today. EMS reports pt was diaphoretic, BP-144/97, 92% RA, CBG-191, temp 97.  EMS gave 4 baby aspirin (324)  Pt arrives diaphoretic, pale and states severe pain. Pt unable to answer all questions stating he is hurting too bad.  Md at bedside

## 2019-02-11 NOTE — ED Notes (Signed)
Called pharmacy and they will send up nitro, non stocked in pyxis

## 2019-02-11 NOTE — Anesthesia Procedure Notes (Signed)
Arterial Line Insertion Start/End1/20/2021 6:35 PM, 02/11/2019 6:38 PM Performed by: Tillman Abide, CRNA, CRNA  Patient location: OOR procedure area. Preanesthetic checklist: patient identified, IV checked, site marked, risks and benefits discussed, surgical consent, monitors and equipment checked, pre-op evaluation, timeout performed and anesthesia consent Emergency situation Patient sedated Left, radial was placed Catheter size: 20 G Hand hygiene performed , maximum sterile barriers used  and Seldinger technique used Allen's test indicative of satisfactory collateral circulation Attempts: 1 Procedure performed without using ultrasound guided technique. Following insertion, dressing applied and Biopatch. Post procedure assessment: normal  Patient tolerated the procedure well with no immediate complications.

## 2019-02-11 NOTE — Progress Notes (Signed)
  Echocardiogram Echocardiogram Transesophageal has been performed.  Delcie Roch 02/11/2019, 8:45 PM

## 2019-02-11 NOTE — Anesthesia Procedure Notes (Addendum)
Procedure Name: Intubation Date/Time: 02/11/2019 7:48 PM Performed by: Jearld Pies, CRNA Pre-anesthesia Checklist: Patient identified, Emergency Drugs available, Suction available and Patient being monitored Patient Re-evaluated:Patient Re-evaluated prior to induction Oxygen Delivery Method: Circle System Utilized Preoxygenation: Pre-oxygenation with 100% oxygen Induction Type: IV induction, Rapid sequence and Cricoid Pressure applied Laryngoscope Size: Mac and 4 Grade View: Grade III Tube type: Oral Tube size: 7.5 mm Number of attempts: 1 Airway Equipment and Method: Stylet Placement Confirmation: ETT inserted through vocal cords under direct vision,  positive ETCO2 and breath sounds checked- equal and bilateral Secured at: 23 cm Tube secured with: Tape Dental Injury: Teeth and Oropharynx as per pre-operative assessment  Difficulty Due To: Difficult Airway- due to anterior larynx Comments: Performed by Valda Lamb CRNA

## 2019-02-11 NOTE — H&P (Addendum)
[address]       [place] [address]             [phone]      Subjective:  Patient is a 64 year old male with a past medical history of hypertension, coronary artery disease status post CABG who presented to [facility] with severe onset of central chest pain.  He described this as a tightness.  It started approximately 11:30 AM while he was driving.  He pulled over the side of the road and called EMS.  The pain was constant and 10/10 in severity with some radiation to the right arm and shoulder.  Additionally, it was associated with shortness of breath as well as diaphoresis. He also felt lightheaded and describes dry heaves. He did not have any actual vomiting. 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  This is been reviewed by the surgeon and the full report is listed below.  He is felt to require admission for continued evaluation and probable urgent intraoperative management.  Patient Active Problem List   Diagnosis Date Noted  . Essential hypertension 02/11/2019  . COPD (chronic obstructive pulmonary disease) (HCC) 02/11/2019  . Duodenal ulcer 02/11/2019  . Tobacco abuse 02/11/2019  . Generalized anxiety disorder 02/11/2019  . 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 DVT (deep venous thrombosis) (HCC) 02/11/2019   Past Medical History:  Diagnosis Date  . Anxiety   . COPD (chronic obstructive pulmonary disease) (HCC)   . Duodenal ulcer   . DVT (deep venous thrombosis) (HCC)   . Hx of CABG   . Hypertension       Medications Prior to Admission  Medication Sig Dispense Refill Last Dose  . acetaminophen (TYLENOL) 325 MG tablet Take 650 mg by mouth 3 (three) times daily as needed for mild pain or fever.     Marland Kitchen albuterol (VENTOLIN HFA) 108 (90 Base) MCG/ACT inhaler Inhale 2 puffs into the lungs every 4 (four) hours as needed for wheezing or shortness  of breath.     . beclomethasone (QVAR) 80 MCG/ACT inhaler Inhale 1 puff into the lungs 2 (two) times daily.     . carboxymethylcellulose 1 % ophthalmic solution Apply 1 drop to eye 4 (four) times daily.     Marland Kitchen ezetimibe (ZETIA) 10 MG tablet Take 10 mg by mouth daily.     Marland Kitchen gabapentin (NEURONTIN) 300 MG capsule Take 300 mg by mouth at bedtime.     . hydrochlorothiazide (HYDRODIURIL) 25 MG tablet Take 25 mg by mouth every morning.     Marland Kitchen HYDROcodone-acetaminophen (NORCO/VICODIN) 5-325 MG tablet Take 1 tablet by mouth 2 (two) times daily as needed for moderate pain.     . hydrOXYzine (VISTARIL) 25 MG capsule Take 25 mg by mouth every 8 (eight) hours as needed for anxiety.     Marland Kitchen lisinopril (ZESTRIL) 10 MG tablet Take 5 mg by mouth daily.     . metFORMIN (GLUCOPHAGE) 500 MG tablet Take 500 mg by mouth 2 (two) times daily with a meal.     . metoprolol tartrate (LOPRESSOR) 25 MG tablet Take 25 mg by mouth 2 (two) times daily.     . nicotine polacrilex (COMMIT) 4 MG lozenge Take 4 mg by mouth as directed.     Marland Kitchen oxybutynin (DITROPAN-XL) 5 MG 24 hr tablet Take 5 mg by mouth daily.     Marland Kitchen  pantoprazole (PROTONIX) 40 MG tablet Take 40 mg by mouth daily.     . rivaroxaban (XARELTO) 20 MG TABS tablet Take 20 mg by mouth daily with lunch.     . rosuvastatin (CRESTOR) 40 MG tablet Take 40 mg by mouth at bedtime.     Marland Kitchen tiotropium (SPIRIVA) 18 MCG inhalation capsule Place 36 mcg into inhaler and inhale daily.     Marland Kitchen venlafaxine XR (EFFEXOR-XR) 75 MG 24 hr capsule Take 225 mg by mouth daily.     . Vitamin D3 (VITAMIN D) 25 MCG tablet Take 1,000 Units by mouth daily.      Not on File  Social History   Tobacco Use  . Smoking status: Current Every Day Smoker    Types: Cigarettes  Substance Use Topics  . Alcohol use: Not on file   Smokes approx 5 cigarettes /day currently   No family history on file.   Past Medical History:  Diagnosis Date  . Anxiety   . COPD (chronic obstructive pulmonary disease) ([facility])    . Duodenal ulcer   . DVT (deep venous thrombosis) ([place])   . Hx of CABG   . Hypertension       Allergies: PCN,  Intolerance to heparin with Duodenal ulcer Review of Systems  Review of Systems  Constitutional: Positive for diaphoresis. Negative for chills and fever.  HENT: Negative.   Eyes: Negative.   Respiratory: Positive for shortness of breath.   Cardiovascular: Positive for chest pain. Negative for palpitations, orthopnea, claudication, leg swelling and PND.  Gastrointestinal: Positive for nausea. Negative for abdominal pain and vomiting.  Genitourinary: Negative.   Musculoskeletal: Negative.   Skin: Negative.   Neurological: Positive for weakness.  Endo/Heme/Allergies: Negative.   Psychiatric/Behavioral: The patient is nervous/anxious.     Objective:   No data found. No intake/output data recorded. No intake/output data recorded.  CT Angio Chest/Abd/Pel for Dissection W and/or Wo Contrast  Result Date: 02/11/2019 CLINICAL DATA:  Central chest pain radiating to right arm EXAM: CT ANGIOGRAPHY CHEST, ABDOMEN AND PELVIS TECHNIQUE: Multidetector CT imaging through the chest, abdomen and pelvis was performed using the standard protocol during bolus administration of intravenous contrast. Multiplanar reconstructed images and MIPs were obtained and reviewed to evaluate the vascular anatomy. CONTRAST:  176mL OMNIPAQUE IOHEXOL 350 MG/ML SOLN COMPARISON:  None. FINDINGS: CTA CHEST FINDINGS Cardiovascular: Heart size normal. No pericardial effusion. Satisfactory opacification of pulmonary arteries noted, and there is no evidence of pulmonary emboli. Coronary calcifications. Patient is status post CABG. Large probable pseudoaneurysm measuring at least 4.2 cm associated with right coronary artery graft, which remains patent distally. There is a large adjacent mediastinal hematoma measuring 11.6 x 5.9 cm, resulting in some mass effect upon the right atrium and distal SVC, as well as some  hemorrhage extending throughout the middle and posterior mediastinum. Adequate contrast opacification of the thoracic aorta with no evidence of dissection, aneurysm, or stenosis. There is bovine variant brachiocephalic arch anatomy without proximal stenosis. Minimal calcified plaque in the descending thoracic segment. Mediastinum/Nodes: Mediastinal hemorrhage and 11.6 cm cm hematoma as above. No definite adenopathy. Lungs/Pleura: No pleural effusion. No pneumothorax. Focal right infrahilar consolidation/atelectasis in the right middle lobe. Mild dependent atelectasis in the right lower lobe. Linear scarring or atelectasis in the lingula. Musculoskeletal: Sternotomy wires. Anterior vertebral endplate spurring at multiple levels in the mid and lower thoracic spine. No fracture or worrisome bone lesion. Review of the MIP images confirms the above findings. CTA ABDOMEN AND PELVIS FINDINGS VASCULAR Aorta:  Moderate calcified plaque. Fusiform infrarenal aneurysm 5.6 x 4.9 cm, tapering to normal caliber above the bifurcation. There is some eccentric nonocclusive mural thrombus in the aneurysmal segment. No evidence of rupture or impending rupture. No dissection or stenosis. Celiac: Partially calcified ostial plaque resulting in short segment stenosis of at least mild severity, patent distally. SMA: Patent without evidence of aneurysm, dissection, vasculitis or significant stenosis. Renals: Single left, widely patent. Duplicated right, inferior dominant, both patent. IMA: Patent, arising from the aneurysmal segment of the aorta. 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 The internal and external iliac arteries are ectatic with scattered mild plaque, no aneurysm or stenosis. Visualized lower extremity arterial outflow is ectatic, patent. Veins: Infrarenal IVC filter with continued caval patency as evidence by contrast reflux to the  infrarenal segment. No obvious venous pathology within the limitations of this arterial phase study. Review of the MIP images confirms the above findings. NON-VASCULAR Hepatobiliary: No focal liver abnormality is seen. No gallstones, gallbladder wall thickening, or biliary dilatation. Pancreas: Unremarkable. No pancreatic ductal dilatation or surrounding inflammatory changes. Spleen: Normal in size without focal abnormality. Adrenals/Urinary Tract: Adrenal glands unremarkable. Kidneys enhance normally, without hydronephrosis. Urinary bladder incompletely distended. Stomach/Bowel: Stomach is only partially distended, unremarkable. Small bowel is nondilated. Appendix not identified. The colon is nondilated, with distal descending and sigmoid diverticula; no significant adjacent inflammatory/edematous change or abscess. Lymphatic: No abdominal or pelvic adenopathy. Reproductive: Mild prostate enlargement. Other: No ascites. No free air. Musculoskeletal: No acute or significant osseous findings. Review of the MIP images confirms the above findings. IMPRESSION: 1. 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 Recommend emergent surgical consultation. Critical Value/emergent results were called by telephone at the time of interpretation on 02/11/2019 at 2:45 pm to providerPHILLIP STAFFORD , who verbally acknowledged these results. 2. Negative for acute PE or aortic dissection. 3. 5.6 cm infrarenal abdominal aortic aneurysm without complicating features. Vascular surgery consultation recommended due to increased risk of rupture for AAA >5.5 cm. This recommendation follows ACR consensus guidelines: White Paper of the ACR Incidental Findings Committee II on Vascular Findings. J Am Coll Radiol 2013; 97:989-211. 4. 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 Electronically Signed   By: Corlis Leak M.D.   On:  02/11/2019 14:49    Recent Results (from the past 2160 hour(s))  Comprehensive metabolic panel     Status: Abnormal   Collection Time: 02/11/19 12:03 PM  Result Value Ref Range   Sodium 141 135 - 145 mmol/L   Potassium 4.5 3.5 - 5.1 mmol/L   Chloride 104 98 - 111 mmol/L   CO2 24 22 - 32 mmol/L   Glucose, Bld 238 (H) 70 - 99 mg/dL   BUN 14 8 - 23 mg/dL   Creatinine, Ser 9.41 0.61 - 1.24 mg/dL   Calcium 8.8 (L) 8.9 - 10.3 mg/dL   Total Protein 7.7 6.5 - 8.1 g/dL   Albumin 4.4 3.5 - 5.0 g/dL   AST 25 15 - 41 U/L   ALT 24 0 - 44 U/L   Alkaline Phosphatase 72 38 - 126 U/L   Total Bilirubin 0.6 0.3 - 1.2 mg/dL   GFR calc non Af Amer >60 >60 mL/min   GFR calc Af Amer >60 >60 mL/min   Anion gap 13 5 - 15    Comment:  Performed at [facility], [address] [place], [place] [address]  Troponin I (High Sensitivity)     Status: Abnormal   Collection Time: 02/11/19 12:03 PM  Result Value Ref Range   Troponin I (High Sensitivity) 21 (H) <18 ng/L    Comment: (NOTE) Elevated high sensitivity troponin I (hsTnI) values and significant  changes across serial measurements may suggest ACS but many other  chronic and acute conditions are known to elevate hsTnI results.  Refer to the "Links" section for chest pain algorithms and additional  guidance. Performed at [facility], [address] [place], [place] [address]   CBC with Differential     Status: Abnormal   Collection Time: 02/11/19 12:03 PM  Result Value Ref Range   WBC 11.4 (H) 4.0 - 10.5 K/uL   RBC 4.98 4.22 - 5.81 MIL/uL   Hemoglobin 15.3 13.0 - 17.0 g/dL   HCT 25.4 27.0 - 62.3 %   MCV 95.6 80.0 - 100.0 fL   MCH 30.7 26.0 - 34.0 pg   MCHC 32.1 30.0 - 36.0 g/dL   RDW 76.2 83.1 - 51.7 %   Platelets 256 150 - 400 K/uL   nRBC 0.0 0.0 - 0.2 %   Neutrophils Relative % 56 %   Neutro Abs 6.3 1.7 - 7.7 K/uL   Lymphocytes Relative 29 %   Lymphs Abs 3.3 0.7 - 4.0 K/uL   Monocytes Relative 9 %   Monocytes  Absolute 1.0 0.1 - 1.0 K/uL   Eosinophils Relative 3 %   Eosinophils Absolute 0.4 0.0 - 0.5 K/uL   Basophils Relative 1 %   Basophils Absolute 0.1 0.0 - 0.1 K/uL   Immature Granulocytes 2 %   Abs Immature Granulocytes 0.21 (H) 0.00 - 0.07 K/uL    Comment: Performed at [facility], [address] [place], [place] [address]  Respiratory Panel by RT PCR (Flu A&B, Covid) - Nasopharyngeal Swab     Status: None   Collection Time: 02/11/19 12:03 PM   Specimen: Nasopharyngeal Swab  Result Value Ref Range   SARS Coronavirus 2 by RT PCR NEGATIVE NEGATIVE    Comment: (NOTE) SARS-CoV-2 target nucleic acids are NOT DETECTED. The SARS-CoV-2 RNA is generally detectable in upper respiratoy specimens during the acute phase of infection. The lowest concentration of SARS-CoV-2 viral copies this assay can detect is 131 copies/mL. A negative result does not preclude SARS-Cov-2 infection and should not be used as the sole basis for treatment or other patient management decisions. A negative result may occur with  improper specimen collection/handling, submission of specimen other than nasopharyngeal swab, presence of viral mutation(s) within the areas targeted by this assay, and inadequate number of viral copies (<131 copies/mL). A negative result must be combined with clinical observations, patient history, and epidemiological information. The expected result is Negative. Fact Sheet for Patients:  https://www.moore.com/ Fact Sheet for Healthcare Providers:  https://www.young.biz/ This test is not yet ap proved or cleared by the [place] FDA and  has been authorized for detection and/or diagnosis of SARS-CoV-2 by FDA under an Emergency Use Authorization (EUA). This EUA will remain  in effect (meaning this test can be used) for the duration of the COVID-19 declaration under Section 564(b)(1) of the Act, 21 U.S.C. section 360bbb-3(b)(1), unless the  authorization is terminated or revoked sooner.    Influenza A by PCR NEGATIVE NEGATIVE   Influenza B by PCR NEGATIVE NEGATIVE    Comment: (NOTE) The Xpert Xpress SARS-CoV-2/FLU/RSV assay is  intended as an aid in  the diagnosis of influenza from Nasopharyngeal swab specimens and  should not be used as a sole basis for treatment. Nasal washings and  aspirates are unacceptable for Xpert Xpress SARS-CoV-2/FLU/RSV  testing. Fact Sheet for Patients: https://www.moore.com/ Fact Sheet for Healthcare Providers: https://www.young.biz/ This test is not yet approved or cleared by the [place] FDA and  has been authorized for detection and/or diagnosis of SARS-CoV-2 by  FDA under an Emergency Use Authorization (EUA). This EUA will remain  in effect (meaning this test can be used) for the duration of the  Covid-19 declaration under Section 564(b)(1) of the Act, 21  U.S.C. section 360bbb-3(b)(1), unless the authorization is  terminated or revoked. Performed at [facility], [address] [place], [place] [address]   APTT     Status: None   Collection Time: 02/11/19 12:03 PM  Result Value Ref Range   aPTT 28 24 - 36 seconds    Comment: Performed at [facility], [address] [place], [place] [address]  Protime-INR     Status: None   Collection Time: 02/11/19 12:03 PM  Result Value Ref Range   Prothrombin Time 11.9 11.4 - 15.2 seconds   INR 0.9 0.8 - 1.2    Comment: (NOTE) INR goal varies based on device and disease states. Performed at [facility], [address] [place], [place] [address]   Troponin I (High Sensitivity)     Status: Abnormal   Collection Time: 02/11/19  3:06 PM  Result Value Ref Range   Troponin I (High Sensitivity) 210 (HH) <18 ng/L    Comment: CRITICAL RESULT CALLED TO, READ BACK BY AND VERIFIED WITH TIFFANY JOHNSON @1555  02/11/19 MJU (NOTE) Elevated high sensitivity troponin I (hsTnI)  values and significant  changes across serial measurements may suggest ACS but many other  chronic and acute conditions are known to elevate hsTnI results.  Refer to the "Links" section for chest pain algorithms and additional  guidance. Performed at [facility], [address] [place], [place]   Glucose, capillary     Status: Abnormal   Collection Time: 02/11/19  4:11 PM  Result Value Ref Range   Glucose-Capillary 174 (H) 70 - 99 mg/dL  ECHOCARDIOGRAM LIMITED     Status: None (In process)   Collection Time: 02/11/19  5:18 PM  Result Value Ref Range   Weight 4,048 oz   Height 72 in   BP 158/114 mmHg  Glucose, capillary     Status: Abnormal   Collection Time: 02/11/19  5:19 PM  Result Value Ref Range   Glucose-Capillary 136 (H) 70 - 99 mg/dL   Physical Exam  Constitutional: No distress. He appears acutely ill.  Obese WM  HENT:  Nose: No nasal discharge.  Mouth/Throat: Oropharynx is clear. Pharynx is normal.  edentulous  Eyes: Pupils are equal, round, and reactive to light. Conjunctivae are normal.  Neck: Thyroid normal. No JVD present. No neck adenopathy. No thyromegaly present.  Cardiovascular: Normal rate, regular rhythm, S1 normal, S2 normal, normal heart sounds and intact distal pulses. Exam reveals no gallop.  No murmur heard. Pulmonary/Chest: Breath sounds normal. He has no wheezes. He has no rales. He exhibits no tenderness.  Abdominal: Soft. Bowel sounds are normal. He exhibits no distension and no mass. There is no abdominal tenderness.  Musculoskeletal:        General: No edema.     Cervical back: Normal range of motion and neck supple.  Neurological: He is alert and oriented to person, place, and time. He has normal motor skills.  Skin: Skin is warm and dry. No rash noted. No cyanosis. No jaundice or pallor. Nails show no clubbing.      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  Patient with history of multivessel CABG in [place] in 2002.  No cardiac catheterization since that time.  No records from the surgery available.  Agree with above assessment by Gershon Crane, PA-C.  Patient has an acute symptomatic ascending aortic pseudoaneurysm from an apparent tear in the area of the proximal right coronary anastomosis.  This is partially contained but with ongoing pain he is at risk for rerupture.  Patient is being prepared for emergency cardiac surgery, redo sternotomy aortic repair and probable regrafting of the right coronary vein graft.  I discussed the plan of surgery with the patient occluding the benefits of improved survival and preservation of LV function and the risks of death, organ failure, and stroke.  Patient agrees to proceed with emergency redo sternotomy, repair of aortic pseudoaneurysm, and redo bypass grafting.

## 2019-02-11 NOTE — Progress Notes (Signed)
  Echocardiogram 2D Echocardiogram has been performed.  Delcie Roch 02/11/2019, 6:07 PM

## 2019-02-11 NOTE — Anesthesia Procedure Notes (Signed)
Central Venous Catheter Insertion Performed by: Annye Asa, MD, anesthesiologist Start/End1/20/2021 7:00 PM, 02/11/2019 7:19 PM Patient location: Pre-op. Emergency situation Preanesthetic checklist: patient identified, IV checked, site marked, risks and benefits discussed, surgical consent, monitors and equipment checked, pre-op evaluation, timeout performed and anesthesia consent Position: supine Lidocaine 1% used for infiltration and patient sedated Hand hygiene performed , maximum sterile barriers used  and Seldinger technique used Catheter size: 7.5 Fr PA cath was placed.Sheath introducer Swan type:thermodilution Procedure performed using ultrasound guided technique. Ultrasound Notes:anatomy identified, needle tip was noted to be adjacent to the nerve/plexus identified, no ultrasound evidence of intravascular and/or intraneural injection and image(s) printed for medical record Attempts: 1 Following insertion, line sutured, dressing applied and Biopatch. Post procedure assessment: blood return through all ports, free fluid flow and no air  Patient tolerated the procedure well with no immediate complications. Additional procedure comments: PA catheter:  Routine monitors. Timeout, sterile prep, drape, FBP R neck.  Supine position.  1% Lido local, finder and trocar RIJ 1st pass with US guidance.  Cordis placed over J wire. PA catheter in easily.  Sterile dressing applied.  Patient tolerated well, VSS.  Jenita Seashore, MD.

## 2019-02-11 NOTE — Anesthesia Preprocedure Evaluation (Addendum)
Anesthesia Evaluation  Patient identified by MRN, date of birth, ID band Patient awake    Reviewed: Allergy & Precautions, NPO status , Patient's Chart, lab work & pertinent test results, reviewed documented beta blocker date and time   History of Anesthesia Complications Negative for: history of anesthetic complications  Airway Mallampati: II  TM Distance: >3 FB Neck ROM: Full    Dental  (+) Edentulous Upper, Edentulous Lower   Pulmonary sleep apnea and Continuous Positive Airway Pressure Ventilation , COPD,  COPD inhaler, Current Smoker,    breath sounds clear to auscultation       Cardiovascular hypertension, Pt. on medications and Pt. on home beta blockers + angina + CAD, + CABG and + DVT (IVC filter)   Rhythm:Regular Rate:Normal  Acute chest pain, pseudoaneurysm of asc aorta   Neuro/Psych Anxiety negative neurological ROS     GI/Hepatic Neg liver ROS, GERD  Medicated and Poorly Controlled,  Endo/Other  diabetes (glu 136)Morbid obesityobese  Renal/GU negative Renal ROS     Musculoskeletal   Abdominal (+) + obese,   Peds  Hematology negative hematology ROS (+)   Anesthesia Other Findings   Reproductive/Obstetrics                            Anesthesia Physical Anesthesia Plan  ASA: IV and emergent  Anesthesia Plan: General   Post-op Pain Management:    Induction: Intravenous and Rapid sequence  PONV Risk Score and Plan: 1 and Treatment may vary due to age or medical condition  Airway Management Planned: Oral ETT  Additional Equipment: Arterial line, PA Cath, TEE and Ultrasound Guidance Line Placement  Intra-op Plan:   Post-operative Plan: Post-operative intubation/ventilation  Informed Consent: I have reviewed the patients History and Physical, chart, labs and discussed the procedure including the risks, benefits and alternatives for the proposed anesthesia with the patient  or authorized representative who has indicated his/her understanding and acceptance.       Plan Discussed with: CRNA and Surgeon  Anesthesia Plan Comments:         Anesthesia Quick Evaluation

## 2019-02-11 NOTE — Consult Note (Signed)
ANTICOAGULATION CONSULT NOTE - Initial Consult  Pharmacy Consult for Heparin Infusion Indication: chest pain/ACS  Not on File  Patient Measurements: Height: 6' (182.9 cm) Weight: 253 lb (114.8 kg) IBW/kg (Calculated) : 77.6 Heparin Dosing Weight: 102.3 kg  Vital Signs: Temp: 98 F (36.7 C) (01/20 1210) BP: 109/66 (01/20 1330) Pulse Rate: 87 (01/20 1330)  Labs: Recent Labs    02/11/19 1203  HGB 15.3  HCT 47.6  PLT 256  APTT 28  LABPROT 11.9  INR 0.9  CREATININE 1.08  TROPONINIHS 21*    Estimated Creatinine Clearance: 91.6 mL/min (by C-G formula based on SCr of 1.08 mg/dL).   Medical History: No past medical history on file.  Medications:  (Not in a hospital admission)  Scheduled:   Infusions:  . heparin 1,400 Units/hr (02/11/19 1305)   PRN: iohexol, nitroGLYCERIN Anti-infectives (From admission, onward)   None      Assessment: Pharmacy has been consulted to initiate Heparin Infusion in 64yo patient complaining of chest pain that radiates to right arm. Patient has history of CABG and did not take morning meds. Patient has no PTA history of anticoagulant use.   Goal of Therapy:  Heparin level 0.3-0.7 units/ml Monitor platelets by anticoagulation protocol: Yes   Plan:  Give 4000 units bolus x 1 Start heparin infusion at 1400 units/hr Check anti-Xa level in 6 hours and daily while on heparin Continue to monitor H&H and platelets  Lilla Callejo A Beatrice Ziehm 02/11/2019,1:51 PM

## 2019-02-11 NOTE — ED Provider Notes (Signed)
[facility] Emergency Department Provider Note  ____________________________________________  Time seen: Approximately 3:16 PM  I have reviewed the triage vital signs and the nursing notes.   HISTORY  Chief Complaint Chest Pain    HPI Evan Preston is a 64 y.o. male with past medical history of hypertension, CAD status post CABG who reports sudden onset of severe central chest pain described as tightness that started at 11:30 AM today while the patient was driving.  He pulled over to the side of the road and called EMS.  Pain is constant, 10/10 in severity, radiating to right shoulder.  Associated shortness of breath and diaphoresis.  No aggravating or alleviating factors.  Meds: Xarelto Protonix Metoprolol HCTZ Lisinopril  Medications last taken 2 days ago  No past medical history on file. Hypertension CAD  There are no problems to display for this patient.    Past surgical history CABG performed in [place] in 2002   Prior to Admission medications   Medication Sig Start Date End Date Taking? Authorizing Provider  acetaminophen (TYLENOL) 325 MG tablet Take 650 mg by mouth 3 (three) times daily as needed for mild pain or fever.   Yes [provider]  albuterol (VENTOLIN HFA) 108 (90 Base) MCG/ACT inhaler Inhale 2 puffs into the lungs every 4 (four) hours as needed for wheezing or shortness of breath.   Yes [provider]  beclomethasone (QVAR) 80 MCG/ACT inhaler Inhale 1 puff into the lungs 2 (two) times daily.   Yes [provider]  carboxymethylcellulose 1 % ophthalmic solution Apply 1 drop to eye 4 (four) times daily.   Yes [provider]  ezetimibe (ZETIA) 10 MG tablet Take 10 mg by mouth daily.   Yes [provider]  gabapentin (NEURONTIN) 300 MG capsule Take 300 mg by mouth at bedtime.   Yes [provider]  hydrochlorothiazide (HYDRODIURIL) 25 MG tablet Take 25 mg by mouth every  morning.   Yes [provider]  HYDROcodone-acetaminophen (NORCO/VICODIN) 5-325 MG tablet Take 1 tablet by mouth 2 (two) times daily as needed for moderate pain.   Yes [provider]  hydrOXYzine (VISTARIL) 25 MG capsule Take 25 mg by mouth every 8 (eight) hours as needed for anxiety.   Yes [provider]  lisinopril (ZESTRIL) 10 MG tablet Take 5 mg by mouth daily.   Yes [provider]  metFORMIN (GLUCOPHAGE) 500 MG tablet Take 500 mg by mouth 2 (two) times daily with a meal.   Yes [provider]  metoprolol tartrate (LOPRESSOR) 25 MG tablet Take 25 mg by mouth 2 (two) times daily.   Yes [provider]  nicotine polacrilex (COMMIT) 4 MG lozenge Take 4 mg by mouth as directed.   Yes [provider]  oxybutynin (DITROPAN-XL) 5 MG 24 hr tablet Take 5 mg by mouth daily.   Yes [provider]  pantoprazole (PROTONIX) 40 MG tablet Take 40 mg by mouth daily.   Yes [provider]  rivaroxaban (XARELTO) 20 MG TABS tablet Take 20 mg by mouth daily with lunch.   Yes [provider]  rosuvastatin (CRESTOR) 40 MG tablet Take 40 mg by mouth at bedtime.   Yes [provider]  tiotropium (SPIRIVA) 18 MCG inhalation capsule Place 36 mcg into inhaler and inhale daily.   Yes [provider]  venlafaxine XR (EFFEXOR-XR) 75 MG 24 hr capsule Take 225 mg by mouth daily.   Yes [provider]  Vitamin D3 (VITAMIN  D) 25 MCG tablet Take 1,000 Units by mouth daily.   Yes [provider]  Xarelto Protonix Metoprolol HCTZ Lisinopril  Medications last taken 2 days ago   Allergies Patient has no allergy information on record.   No family history on file.  Social History Social History   Tobacco Use  . Smoking status: Not on file  Substance Use Topics  . Alcohol use: Not on file  . Drug use: Not on file  No tobacco or alcohol use  Review of Systems  Constitutional:   No fever  or chills.  ENT:   No sore throat. No rhinorrhea. Cardiovascular: Positive chest pain as above without syncope. Respiratory:   No dyspnea or cough. Gastrointestinal:   Negative for abdominal pain, vomiting and diarrhea.  Musculoskeletal:   Negative for focal pain or swelling All other systems reviewed and are negative except as documented above in ROS and HPI.  ____________________________________________   PHYSICAL EXAM:  VITAL SIGNS: ED Triage Vitals  Enc Vitals Group     BP 02/11/19 1210 113/83     Pulse Rate 02/11/19 1210 83     Resp 02/11/19 1210 20     Temp 02/11/19 1210 98 F (36.7 C)     Temp src --      SpO2 02/11/19 1210 97 %     Weight 02/11/19 1206 253 lb (114.8 kg)     Height 02/11/19 1206 6' (1.829 m)     Head Circumference --      Peak Flow --      Pain Score 02/11/19 1206 10     Pain Loc --      Pain Edu? --      Excl. in GC? --     Vital signs reviewed, nursing assessments reviewed.   Constitutional:   Alert and oriented.  Ill-appearing Eyes:   Conjunctivae are normal. EOMI. PERRL. ENT      Head:   Normocephalic and atraumatic.      Nose:   Wearing a mask.      Mouth/Throat:   Wearing a mask.      Neck:   No meningismus. Full ROM.  No crepitus Hematological/Lymphatic/Immunilogical:   No cervical lymphadenopathy. Cardiovascular:   RRR. Symmetric bilateral radial and DP pulses.  No murmurs. Cap refill less than 2 seconds. Respiratory:   Normal respiratory effort without tachypnea/retractions. Breath sounds are clear and equal bilaterally. No wheezes/rales/rhonchi. Gastrointestinal:   Soft and nontender. Non distended. There is no CVA tenderness.  No rebound, rigidity, or guarding.  Musculoskeletal:   Normal range of motion in all extremities. No joint effusions.  No lower extremity tenderness.  No edema. Neurologic:   Normal speech and language.  Motor grossly intact. No acute focal neurologic deficits are appreciated.  Skin:    Skin is warm, dry  and intact. No rash noted.  No petechiae, purpura, or bullae.  ____________________________________________    LABS (pertinent positives/negatives) (all labs ordered are listed, but only abnormal results are displayed) Labs Reviewed  COMPREHENSIVE METABOLIC PANEL - Abnormal; Notable for the following components:      Result Value   Glucose, Bld 238 (*)    Calcium 8.8 (*)    All other components within normal limits  CBC WITH DIFFERENTIAL/PLATELET - Abnormal; Notable for the following components:   WBC 11.4 (*)    Abs Immature Granulocytes 0.21 (*)    All other components within normal limits  TROPONIN I (HIGH SENSITIVITY) - Abnormal; Notable for the following  components:   Troponin I (High Sensitivity) 21 (*)    All other components within normal limits  RESPIRATORY PANEL BY RT PCR (FLU A&B, COVID)  APTT  PROTIME-INR  TROPONIN I (HIGH SENSITIVITY)   ____________________________________________   EKG  Interpreted by me Sinus rhythm rate of 84, normal axis and intervals.  Normal QRS.  Small ST depression in V4 and V5.  Normal T waves.  Repeat EKG at 12:28 PM shows no interval change.  Persistent ST depression in V3 through V5, no ST elevation, no T wave changes.  ____________________________________________    RADIOLOGY  CT Angio Chest/Abd/Pel for Dissection W and/or Wo Contrast  Result Date: 02/11/2019 CLINICAL DATA:  Central chest pain radiating to right arm EXAM: CT ANGIOGRAPHY CHEST, ABDOMEN AND PELVIS TECHNIQUE: Multidetector CT imaging through the chest, abdomen and pelvis was performed using the standard protocol during bolus administration of intravenous contrast. Multiplanar reconstructed images and MIPs were obtained and reviewed to evaluate the vascular anatomy. CONTRAST:  11mL OMNIPAQUE IOHEXOL 350 MG/ML SOLN COMPARISON:  None. FINDINGS: CTA CHEST FINDINGS Cardiovascular: Heart size normal. No pericardial effusion. Satisfactory opacification of pulmonary arteries  noted, and there is no evidence of pulmonary emboli. Coronary calcifications. Patient is status post CABG. Large probable pseudoaneurysm measuring at least 4.2 cm associated with right coronary artery graft, which remains patent distally. There is a large adjacent mediastinal hematoma measuring 11.6 x 5.9 cm, resulting in some mass effect upon the right atrium and distal SVC, as well as some hemorrhage extending throughout the middle and posterior mediastinum. Adequate contrast opacification of the thoracic aorta with no evidence of dissection, aneurysm, or stenosis. There is bovine variant brachiocephalic arch anatomy without proximal stenosis. Minimal calcified plaque in the descending thoracic segment. Mediastinum/Nodes: Mediastinal hemorrhage and 11.6 cm cm hematoma as above. No definite adenopathy. Lungs/Pleura: No pleural effusion. No pneumothorax. Focal right infrahilar consolidation/atelectasis in the right middle lobe. Mild dependent atelectasis in the right lower lobe. Linear scarring or atelectasis in the lingula. Musculoskeletal: Sternotomy wires. Anterior vertebral endplate spurring at multiple levels in the mid and lower thoracic spine. No fracture or worrisome bone lesion. Review of the MIP images confirms the above findings. CTA ABDOMEN AND PELVIS FINDINGS VASCULAR Aorta: Moderate calcified plaque. Fusiform infrarenal aneurysm 5.6 x 4.9 cm, tapering to normal caliber above the bifurcation. There is some eccentric nonocclusive mural thrombus in the aneurysmal segment. No evidence of rupture or impending rupture. No dissection or stenosis. Celiac: Partially calcified ostial plaque resulting in short segment stenosis of at least mild severity, patent distally. SMA: Patent without evidence of aneurysm, dissection, vasculitis or significant stenosis. Renals: Single left, widely patent. Duplicated right, inferior dominant, both patent. IMA: Patent, arising from the aneurysmal segment of the aorta. 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 The internal and external iliac arteries are ectatic with scattered mild plaque, no aneurysm or stenosis. Visualized lower extremity arterial outflow is ectatic, patent. Veins: Infrarenal IVC filter with continued caval patency as evidence by contrast reflux to the infrarenal segment. No obvious venous pathology within the limitations of this arterial phase study. Review of the MIP images confirms the above findings. NON-VASCULAR Hepatobiliary: No focal liver abnormality is seen. No gallstones, gallbladder wall thickening, or biliary dilatation. Pancreas: Unremarkable. No pancreatic ductal dilatation or surrounding inflammatory changes. Spleen: Normal in size without focal abnormality. Adrenals/Urinary Tract: Adrenal glands unremarkable. Kidneys enhance normally, without hydronephrosis. Urinary bladder incompletely distended.  Stomach/Bowel: Stomach is only partially distended, unremarkable. Small bowel is nondilated. Appendix not identified. The colon is nondilated, with distal descending and sigmoid diverticula; no significant adjacent inflammatory/edematous change or abscess. Lymphatic: No abdominal or pelvic adenopathy. Reproductive: Mild prostate enlargement. Other: No ascites. No free air. Musculoskeletal: No acute or significant osseous findings. Review of the MIP images confirms the above findings. IMPRESSION: 1. 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 Recommend emergent surgical consultation. Critical Value/emergent results were called by telephone at the time of interpretation on 02/11/2019 at 2:45 pm to providerPHILLIP Deyanna Mctier , who verbally acknowledged these results. 2. Negative for acute PE or aortic dissection. 3. 5.6 cm infrarenal abdominal aortic aneurysm without complicating  features. Vascular surgery consultation recommended due to increased risk of rupture for AAA >5.5 cm. This recommendation follows ACR consensus guidelines: White Paper of the ACR Incidental Findings Committee II on Vascular Findings. J Am Coll Radiol 2013; 17:510-258. 4. 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 Electronically Signed   By: Corlis Leak M.D.   On: 02/11/2019 14:49    ____________________________________________   PROCEDURES .Critical Care Performed by: Sharman Cheek, MD Authorized by: Sharman Cheek, MD   Critical care provider statement:    Critical care time (minutes):  35   Critical care time was exclusive of:  Separately billable procedures and treating other patients   Critical care was necessary to treat or prevent imminent or life-threatening deterioration of the following conditions:  Cardiac failure and circulatory failure   Critical care was time spent personally by me on the following activities:  Development of treatment plan with patient or surrogate, discussions with consultants, evaluation of patient's response to treatment, examination of patient, obtaining history from patient or surrogate, ordering and performing treatments and interventions, ordering and review of laboratory studies, ordering and review of radiographic studies, pulse oximetry, re-evaluation of patient's condition and review of old charts    ____________________________________________  DIFFERENTIAL DIAGNOSIS   Non-STEMI, aortic dissection, thoracic aortic aneurysm  CLINICAL IMPRESSION / ASSESSMENT AND PLAN / ED COURSE  Medications ordered in the ED: Medications  nitroGLYCERIN (NITROSTAT) SL tablet 0.4 mg (0.4 mg Sublingual Given 02/11/19 1248)  heparin bolus via infusion 4,000 Units (4,000 Units Intravenous Bolus from Bag 02/11/19 1304)  sodium chloride 0.9 % bolus 500 mL (0 mLs Intravenous Stopped 02/11/19 1404)  iohexol (OMNIPAQUE) 350 MG/ML injection  100 mL (100 mLs Intravenous Contrast Given 02/11/19 1357)  protamine injection 40 mg (40 mg Intravenous Given 02/11/19 1457)  fentaNYL (SUBLIMAZE) injection 50 mcg (50 mcg Intravenous Given 02/11/19 1504)    Pertinent labs & imaging results that were available during my care of the patient were reviewed by me and considered in my medical decision making (see chart for details).  Evan Preston was evaluated in Emergency Department on 02/11/2019 for the symptoms described in the history of present illness. He was evaluated in the context of the global COVID-19 pandemic, which necessitated consideration that the patient might be at risk for infection with the SARS-CoV-2 virus that causes COVID-19. Institutional protocols and algorithms that pertain to the evaluation of patients at risk for COVID-19 are in a state of rapid change based on information released by regulatory bodies including the CDC and federal and state organizations. These policies and algorithms were followed during the patient's care in the ED.     Clinical Course as of Feb 10 1529  Wed Feb 11, 2019  1211 Patient presents with sudden onset of central chest pain described as pressure, severe 10/10, radiating to right shoulder associated shortness of breath and diaphoresis.  Has a history of CABG.  Pain is constant.  Given 324 of aspirin by EMS.  Initial EKG not consistent with STEMI but does show lateral ST depression.  Concerning for non-STEMI.  I will start heparin bolus and infusion, nitroglycerin to pain relief, plan to admit.   [PS]  1308 No response to nitroglycerin, still severe 10/10 pain. Now with hypotension which I think is due to nitrate effect.  Given lack of improvement, i'll obtain CTA to eval for aortic dissection.    [PS]  1512 CT discussed with radiology who notes the ruptured aneurysm of a CABG bypass vein with hemomediastinum.  I discussed with [facility] and waiting for callback from cardiac surgery.   [PS]    Clinical  Course User Index [PS] Sharman Cheek, MD    ----------------------------------------- 3:23 PM on 02/11/2019 -----------------------------------------  Patient accepted by [facility] cardiac surgery, [facility] planning for expeditious assignment of ICU bed and transport.  Patient remained stable with normal vital signs.  Pain is improved with fentanyl.  Heparin is discussed with pharmacy who is sending protamine for reversal.   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 Dictation errors may occur despite best attempts at proofreading.   Sharman Cheek, MD 02/11/19 1531

## 2019-02-12 ENCOUNTER — Inpatient Hospital Stay (HOSPITAL_COMMUNITY): Payer: 59

## 2019-02-12 ENCOUNTER — Encounter (HOSPITAL_COMMUNITY): Payer: Self-pay | Admitting: Cardiothoracic Surgery

## 2019-02-12 DIAGNOSIS — I253 Aneurysm of heart: Secondary | ICD-10-CM | POA: Diagnosis present

## 2019-02-12 LAB — POCT I-STAT, CHEM 8
BUN: 12 mg/dL (ref 8–23)
BUN: 12 mg/dL (ref 8–23)
BUN: 13 mg/dL (ref 8–23)
BUN: 13 mg/dL (ref 8–23)
BUN: 13 mg/dL (ref 8–23)
BUN: 14 mg/dL (ref 8–23)
BUN: 14 mg/dL (ref 8–23)
BUN: 14 mg/dL (ref 8–23)
BUN: 15 mg/dL (ref 8–23)
BUN: 16 mg/dL (ref 8–23)
BUN: 13 mg/dL (ref 8–23)
Calcium, Ion: 0.93 mmol/L — ABNORMAL LOW (ref 1.15–1.40)
Calcium, Ion: 0.97 mmol/L — ABNORMAL LOW (ref 1.15–1.40)
Calcium, Ion: 0.97 mmol/L — ABNORMAL LOW (ref 1.15–1.40)
Calcium, Ion: 1 mmol/L — ABNORMAL LOW (ref 1.15–1.40)
Calcium, Ion: 1.03 mmol/L — ABNORMAL LOW (ref 1.15–1.40)
Calcium, Ion: 1.03 mmol/L — ABNORMAL LOW (ref 1.15–1.40)
Calcium, Ion: 1.04 mmol/L — ABNORMAL LOW (ref 1.15–1.40)
Calcium, Ion: 1.04 mmol/L — ABNORMAL LOW (ref 1.15–1.40)
Calcium, Ion: 1.22 mmol/L (ref 1.15–1.40)
Calcium, Ion: 1.24 mmol/L (ref 1.15–1.40)
Calcium, Ion: 1.12 mmol/L — ABNORMAL LOW (ref 1.15–1.40)
Chloride: 100 mmol/L (ref 98–111)
Chloride: 104 mmol/L (ref 98–111)
Chloride: 104 mmol/L (ref 98–111)
Chloride: 104 mmol/L (ref 98–111)
Chloride: 105 mmol/L (ref 98–111)
Chloride: 105 mmol/L (ref 98–111)
Chloride: 106 mmol/L (ref 98–111)
Chloride: 106 mmol/L (ref 98–111)
Chloride: 107 mmol/L (ref 98–111)
Chloride: 108 mmol/L (ref 98–111)
Chloride: 109 mmol/L (ref 98–111)
Creatinine, Ser: 0.8 mg/dL (ref 0.61–1.24)
Creatinine, Ser: 0.9 mg/dL (ref 0.61–1.24)
Creatinine, Ser: 0.9 mg/dL (ref 0.61–1.24)
Creatinine, Ser: 0.9 mg/dL (ref 0.61–1.24)
Creatinine, Ser: 0.9 mg/dL (ref 0.61–1.24)
Creatinine, Ser: 0.9 mg/dL (ref 0.61–1.24)
Creatinine, Ser: 0.9 mg/dL (ref 0.61–1.24)
Creatinine, Ser: 0.9 mg/dL (ref 0.61–1.24)
Creatinine, Ser: 1 mg/dL (ref 0.61–1.24)
Creatinine, Ser: 1 mg/dL (ref 0.61–1.24)
Creatinine, Ser: 0.8 mg/dL (ref 0.61–1.24)
Glucose, Bld: 115 mg/dL — ABNORMAL HIGH (ref 70–99)
Glucose, Bld: 120 mg/dL — ABNORMAL HIGH (ref 70–99)
Glucose, Bld: 123 mg/dL — ABNORMAL HIGH (ref 70–99)
Glucose, Bld: 127 mg/dL — ABNORMAL HIGH (ref 70–99)
Glucose, Bld: 137 mg/dL — ABNORMAL HIGH (ref 70–99)
Glucose, Bld: 138 mg/dL — ABNORMAL HIGH (ref 70–99)
Glucose, Bld: 192 mg/dL — ABNORMAL HIGH (ref 70–99)
Glucose, Bld: 205 mg/dL — ABNORMAL HIGH (ref 70–99)
Glucose, Bld: 268 mg/dL — ABNORMAL HIGH (ref 70–99)
Glucose, Bld: 294 mg/dL — ABNORMAL HIGH (ref 70–99)
Glucose, Bld: 137 mg/dL — ABNORMAL HIGH (ref 70–99)
HCT: 25 % — ABNORMAL LOW (ref 39.0–52.0)
HCT: 27 % — ABNORMAL LOW (ref 39.0–52.0)
HCT: 30 % — ABNORMAL LOW (ref 39.0–52.0)
HCT: 31 % — ABNORMAL LOW (ref 39.0–52.0)
HCT: 32 % — ABNORMAL LOW (ref 39.0–52.0)
HCT: 32 % — ABNORMAL LOW (ref 39.0–52.0)
HCT: 32 % — ABNORMAL LOW (ref 39.0–52.0)
HCT: 33 % — ABNORMAL LOW (ref 39.0–52.0)
HCT: 33 % — ABNORMAL LOW (ref 39.0–52.0)
HCT: 39 % (ref 39.0–52.0)
HCT: 27 % — ABNORMAL LOW (ref 39.0–52.0)
Hemoglobin: 10.2 g/dL — ABNORMAL LOW (ref 13.0–17.0)
Hemoglobin: 10.5 g/dL — ABNORMAL LOW (ref 13.0–17.0)
Hemoglobin: 10.9 g/dL — ABNORMAL LOW (ref 13.0–17.0)
Hemoglobin: 10.9 g/dL — ABNORMAL LOW (ref 13.0–17.0)
Hemoglobin: 10.9 g/dL — ABNORMAL LOW (ref 13.0–17.0)
Hemoglobin: 11.2 g/dL — ABNORMAL LOW (ref 13.0–17.0)
Hemoglobin: 11.2 g/dL — ABNORMAL LOW (ref 13.0–17.0)
Hemoglobin: 13.3 g/dL (ref 13.0–17.0)
Hemoglobin: 8.5 g/dL — ABNORMAL LOW (ref 13.0–17.0)
Hemoglobin: 9.2 g/dL — ABNORMAL LOW (ref 13.0–17.0)
Hemoglobin: 9.2 g/dL — ABNORMAL LOW (ref 13.0–17.0)
Potassium: 4.2 mmol/L (ref 3.5–5.1)
Potassium: 4.3 mmol/L (ref 3.5–5.1)
Potassium: 5 mmol/L (ref 3.5–5.1)
Potassium: 5 mmol/L (ref 3.5–5.1)
Potassium: 5.2 mmol/L — ABNORMAL HIGH (ref 3.5–5.1)
Potassium: 5.8 mmol/L — ABNORMAL HIGH (ref 3.5–5.1)
Potassium: 6.6 mmol/L (ref 3.5–5.1)
Potassium: 6.7 mmol/L (ref 3.5–5.1)
Potassium: 6.8 mmol/L (ref 3.5–5.1)
Potassium: 7 mmol/L (ref 3.5–5.1)
Potassium: 4 mmol/L (ref 3.5–5.1)
Sodium: 137 mmol/L (ref 135–145)
Sodium: 137 mmol/L (ref 135–145)
Sodium: 137 mmol/L (ref 135–145)
Sodium: 138 mmol/L (ref 135–145)
Sodium: 139 mmol/L (ref 135–145)
Sodium: 140 mmol/L (ref 135–145)
Sodium: 141 mmol/L (ref 135–145)
Sodium: 141 mmol/L (ref 135–145)
Sodium: 142 mmol/L (ref 135–145)
Sodium: 142 mmol/L (ref 135–145)
Sodium: 143 mmol/L (ref 135–145)
TCO2: 23 mmol/L (ref 22–32)
TCO2: 24 mmol/L (ref 22–32)
TCO2: 24 mmol/L (ref 22–32)
TCO2: 25 mmol/L (ref 22–32)
TCO2: 26 mmol/L (ref 22–32)
TCO2: 26 mmol/L (ref 22–32)
TCO2: 26 mmol/L (ref 22–32)
TCO2: 27 mmol/L (ref 22–32)
TCO2: 28 mmol/L (ref 22–32)
TCO2: 28 mmol/L (ref 22–32)
TCO2: 26 mmol/L (ref 22–32)

## 2019-02-12 LAB — POCT I-STAT 7, (LYTES, BLD GAS, ICA,H+H)
Acid-Base Excess: 8 mmol/L — ABNORMAL HIGH (ref 0.0–2.0)
Acid-Base Excess: 3 mmol/L — ABNORMAL HIGH (ref 0.0–2.0)
Acid-Base Excess: 3 mmol/L — ABNORMAL HIGH (ref 0.0–2.0)
Acid-base deficit: 1 mmol/L (ref 0.0–2.0)
Acid-base deficit: 2 mmol/L (ref 0.0–2.0)
Acid-base deficit: 2 mmol/L (ref 0.0–2.0)
Acid-base deficit: 2 mmol/L (ref 0.0–2.0)
Acid-base deficit: 6 mmol/L — ABNORMAL HIGH (ref 0.0–2.0)
Acid-base deficit: 1 mmol/L (ref 0.0–2.0)
Acid-base deficit: 4 mmol/L — ABNORMAL HIGH (ref 0.0–2.0)
Bicarbonate: 29.2 mmol/L — ABNORMAL HIGH (ref 20.0–28.0)
Bicarbonate: 28.8 mmol/L — ABNORMAL HIGH (ref 20.0–28.0)
Bicarbonate: 22.2 mmol/L (ref 20.0–28.0)
Bicarbonate: 22.8 mmol/L (ref 20.0–28.0)
Bicarbonate: 23.9 mmol/L (ref 20.0–28.0)
Bicarbonate: 24.1 mmol/L (ref 20.0–28.0)
Bicarbonate: 24.2 mmol/L (ref 20.0–28.0)
Bicarbonate: 26.9 mmol/L (ref 20.0–28.0)
Bicarbonate: 34.6 mmol/L — ABNORMAL HIGH (ref 20.0–28.0)
Bicarbonate: 23.8 mmol/L (ref 20.0–28.0)
Bicarbonate: 21.3 mmol/L (ref 20.0–28.0)
Calcium, Ion: 1.26 mmol/L (ref 1.15–1.40)
Calcium, Ion: 1.19 mmol/L (ref 1.15–1.40)
Calcium, Ion: 0.89 mmol/L — CL (ref 1.15–1.40)
Calcium, Ion: 1 mmol/L — ABNORMAL LOW (ref 1.15–1.40)
Calcium, Ion: 1.03 mmol/L — ABNORMAL LOW (ref 1.15–1.40)
Calcium, Ion: 1.05 mmol/L — ABNORMAL LOW (ref 1.15–1.40)
Calcium, Ion: 1.1 mmol/L — ABNORMAL LOW (ref 1.15–1.40)
Calcium, Ion: 1.12 mmol/L — ABNORMAL LOW (ref 1.15–1.40)
Calcium, Ion: 1.15 mmol/L (ref 1.15–1.40)
Calcium, Ion: 1.13 mmol/L — ABNORMAL LOW (ref 1.15–1.40)
Calcium, Ion: 1.04 mmol/L — ABNORMAL LOW (ref 1.15–1.40)
HCT: 32 % — ABNORMAL LOW (ref 39.0–52.0)
HCT: 31 % — ABNORMAL LOW (ref 39.0–52.0)
HCT: 24 % — ABNORMAL LOW (ref 39.0–52.0)
HCT: 31 % — ABNORMAL LOW (ref 39.0–52.0)
HCT: 32 % — ABNORMAL LOW (ref 39.0–52.0)
HCT: 32 % — ABNORMAL LOW (ref 39.0–52.0)
HCT: 33 % — ABNORMAL LOW (ref 39.0–52.0)
HCT: 33 % — ABNORMAL LOW (ref 39.0–52.0)
HCT: 36 % — ABNORMAL LOW (ref 39.0–52.0)
HCT: 28 % — ABNORMAL LOW (ref 39.0–52.0)
HCT: 25 % — ABNORMAL LOW (ref 39.0–52.0)
Hemoglobin: 10.9 g/dL — ABNORMAL LOW (ref 13.0–17.0)
Hemoglobin: 10.5 g/dL — ABNORMAL LOW (ref 13.0–17.0)
Hemoglobin: 10.5 g/dL — ABNORMAL LOW (ref 13.0–17.0)
Hemoglobin: 10.9 g/dL — ABNORMAL LOW (ref 13.0–17.0)
Hemoglobin: 10.9 g/dL — ABNORMAL LOW (ref 13.0–17.0)
Hemoglobin: 11.2 g/dL — ABNORMAL LOW (ref 13.0–17.0)
Hemoglobin: 11.2 g/dL — ABNORMAL LOW (ref 13.0–17.0)
Hemoglobin: 12.2 g/dL — ABNORMAL LOW (ref 13.0–17.0)
Hemoglobin: 8.2 g/dL — ABNORMAL LOW (ref 13.0–17.0)
Hemoglobin: 9.5 g/dL — ABNORMAL LOW (ref 13.0–17.0)
Hemoglobin: 8.5 g/dL — ABNORMAL LOW (ref 13.0–17.0)
O2 Saturation: 100 %
O2 Saturation: 98 %
O2 Saturation: 100 %
O2 Saturation: 100 %
O2 Saturation: 100 %
O2 Saturation: 100 %
O2 Saturation: 100 %
O2 Saturation: 100 %
O2 Saturation: 99 %
O2 Saturation: 94 %
O2 Saturation: 93 %
Patient temperature: 36
Patient temperature: 36.6
Patient temperature: 38
Patient temperature: 37.8
Potassium: 4.2 mmol/L (ref 3.5–5.1)
Potassium: 5.2 mmol/L — ABNORMAL HIGH (ref 3.5–5.1)
Potassium: 4.1 mmol/L (ref 3.5–5.1)
Potassium: 4.3 mmol/L (ref 3.5–5.1)
Potassium: 4.4 mmol/L (ref 3.5–5.1)
Potassium: 5.1 mmol/L (ref 3.5–5.1)
Potassium: 5.5 mmol/L — ABNORMAL HIGH (ref 3.5–5.1)
Potassium: 5.9 mmol/L — ABNORMAL HIGH (ref 3.5–5.1)
Potassium: 7 mmol/L (ref 3.5–5.1)
Potassium: 3.9 mmol/L (ref 3.5–5.1)
Potassium: 3.5 mmol/L (ref 3.5–5.1)
Sodium: 144 mmol/L (ref 135–145)
Sodium: 144 mmol/L (ref 135–145)
Sodium: 134 mmol/L — ABNORMAL LOW (ref 135–145)
Sodium: 140 mmol/L (ref 135–145)
Sodium: 140 mmol/L (ref 135–145)
Sodium: 141 mmol/L (ref 135–145)
Sodium: 142 mmol/L (ref 135–145)
Sodium: 142 mmol/L (ref 135–145)
Sodium: 143 mmol/L (ref 135–145)
Sodium: 145 mmol/L (ref 135–145)
Sodium: 147 mmol/L — ABNORMAL HIGH (ref 135–145)
TCO2: 31 mmol/L (ref 22–32)
TCO2: 30 mmol/L (ref 22–32)
TCO2: 24 mmol/L (ref 22–32)
TCO2: 24 mmol/L (ref 22–32)
TCO2: 25 mmol/L (ref 22–32)
TCO2: 25 mmol/L (ref 22–32)
TCO2: 25 mmol/L (ref 22–32)
TCO2: 29 mmol/L (ref 22–32)
TCO2: 36 mmol/L — ABNORMAL HIGH (ref 22–32)
TCO2: 25 mmol/L (ref 22–32)
TCO2: 22 mmol/L (ref 22–32)
pCO2 arterial: 48.6 mmHg — ABNORMAL HIGH (ref 32.0–48.0)
pCO2 arterial: 47.2 mmHg (ref 32.0–48.0)
pCO2 arterial: 39.5 mmHg (ref 32.0–48.0)
pCO2 arterial: 43 mmHg (ref 32.0–48.0)
pCO2 arterial: 43.6 mmHg (ref 32.0–48.0)
pCO2 arterial: 43.8 mmHg (ref 32.0–48.0)
pCO2 arterial: 54.2 mmHg — ABNORMAL HIGH (ref 32.0–48.0)
pCO2 arterial: 56.9 mmHg — ABNORMAL HIGH (ref 32.0–48.0)
pCO2 arterial: 57.8 mmHg — ABNORMAL HIGH (ref 32.0–48.0)
pCO2 arterial: 43.1 mmHg (ref 32.0–48.0)
pCO2 arterial: 38.5 mmHg (ref 32.0–48.0)
pH, Arterial: 7.382 (ref 7.350–7.450)
pH, Arterial: 7.391 (ref 7.350–7.450)
pH, Arterial: 7.199 — CL (ref 7.350–7.450)
pH, Arterial: 7.304 — ABNORMAL LOW (ref 7.350–7.450)
pH, Arterial: 7.348 — ABNORMAL LOW (ref 7.350–7.450)
pH, Arterial: 7.352 (ref 7.350–7.450)
pH, Arterial: 7.352 (ref 7.350–7.450)
pH, Arterial: 7.369 (ref 7.350–7.450)
pH, Arterial: 7.385 (ref 7.350–7.450)
pH, Arterial: 7.355 (ref 7.350–7.450)
pH, Arterial: 7.355 (ref 7.350–7.450)
pO2, Arterial: 242 mmHg — ABNORMAL HIGH (ref 83.0–108.0)
pO2, Arterial: 112 mmHg — ABNORMAL HIGH (ref 83.0–108.0)
pO2, Arterial: 146 mmHg — ABNORMAL HIGH (ref 83.0–108.0)
pO2, Arterial: 197 mmHg — ABNORMAL HIGH (ref 83.0–108.0)
pO2, Arterial: 222 mmHg — ABNORMAL HIGH (ref 83.0–108.0)
pO2, Arterial: 314 mmHg — ABNORMAL HIGH (ref 83.0–108.0)
pO2, Arterial: 315 mmHg — ABNORMAL HIGH (ref 83.0–108.0)
pO2, Arterial: 356 mmHg — ABNORMAL HIGH (ref 83.0–108.0)
pO2, Arterial: 420 mmHg — ABNORMAL HIGH (ref 83.0–108.0)
pO2, Arterial: 79 mmHg — ABNORMAL LOW (ref 83.0–108.0)
pO2, Arterial: 73 mmHg — ABNORMAL LOW (ref 83.0–108.0)

## 2019-02-12 LAB — GLUCOSE, CAPILLARY
Glucose-Capillary: 106 mg/dL — ABNORMAL HIGH (ref 70–99)
Glucose-Capillary: 122 mg/dL — ABNORMAL HIGH (ref 70–99)
Glucose-Capillary: 110 mg/dL — ABNORMAL HIGH (ref 70–99)
Glucose-Capillary: 124 mg/dL — ABNORMAL HIGH (ref 70–99)
Glucose-Capillary: 140 mg/dL — ABNORMAL HIGH (ref 70–99)
Glucose-Capillary: 134 mg/dL — ABNORMAL HIGH (ref 70–99)
Glucose-Capillary: 133 mg/dL — ABNORMAL HIGH (ref 70–99)
Glucose-Capillary: 129 mg/dL — ABNORMAL HIGH (ref 70–99)
Glucose-Capillary: 129 mg/dL — ABNORMAL HIGH (ref 70–99)
Glucose-Capillary: 130 mg/dL — ABNORMAL HIGH (ref 70–99)
Glucose-Capillary: 137 mg/dL — ABNORMAL HIGH (ref 70–99)
Glucose-Capillary: 146 mg/dL — ABNORMAL HIGH (ref 70–99)
Glucose-Capillary: 111 mg/dL — ABNORMAL HIGH (ref 70–99)

## 2019-02-12 LAB — APTT: aPTT: 31 seconds (ref 24–36)

## 2019-02-12 LAB — CBC
HCT: 35.1 % — ABNORMAL LOW (ref 39.0–52.0)
HCT: 32.7 % — ABNORMAL LOW (ref 39.0–52.0)
Hemoglobin: 11.4 g/dL — ABNORMAL LOW (ref 13.0–17.0)
Hemoglobin: 10.8 g/dL — ABNORMAL LOW (ref 13.0–17.0)
MCH: 31.3 pg (ref 26.0–34.0)
MCH: 31.6 pg (ref 26.0–34.0)
MCHC: 32.5 g/dL (ref 30.0–36.0)
MCHC: 33 g/dL (ref 30.0–36.0)
MCV: 96.4 fL (ref 80.0–100.0)
MCV: 95.6 fL (ref 80.0–100.0)
Platelets: 139 10*3/uL — ABNORMAL LOW (ref 150–400)
Platelets: 121 10*3/uL — ABNORMAL LOW (ref 150–400)
RBC: 3.64 MIL/uL — ABNORMAL LOW (ref 4.22–5.81)
RBC: 3.42 MIL/uL — ABNORMAL LOW (ref 4.22–5.81)
RDW: 14.2 % (ref 11.5–15.5)
RDW: 14.5 % (ref 11.5–15.5)
WBC: 16.4 10*3/uL — ABNORMAL HIGH (ref 4.0–10.5)
WBC: 13.1 10*3/uL — ABNORMAL HIGH (ref 4.0–10.5)
nRBC: 0 % (ref 0.0–0.2)
nRBC: 0 % (ref 0.0–0.2)

## 2019-02-12 LAB — MAGNESIUM: Magnesium: 1.7 mg/dL (ref 1.7–2.4)

## 2019-02-12 LAB — PROTIME-INR
INR: 1.2 (ref 0.8–1.2)
Prothrombin Time: 15.4 seconds — ABNORMAL HIGH (ref 11.4–15.2)

## 2019-02-12 LAB — COOXEMETRY PANEL
Carboxyhemoglobin: 1.5 % (ref 0.5–1.5)
Carboxyhemoglobin: 1.6 % — ABNORMAL HIGH (ref 0.5–1.5)
Methemoglobin: 1.2 % (ref 0.0–1.5)
Methemoglobin: 1 % (ref 0.0–1.5)
O2 Saturation: 76.5 %
O2 Saturation: 65.9 %
Total hemoglobin: 10 g/dL — ABNORMAL LOW (ref 12.0–16.0)
Total hemoglobin: 10.5 g/dL — ABNORMAL LOW (ref 12.0–16.0)

## 2019-02-12 LAB — HEMOGLOBIN AND HEMATOCRIT, BLOOD
HCT: 29.4 % — ABNORMAL LOW (ref 39.0–52.0)
Hemoglobin: 9.8 g/dL — ABNORMAL LOW (ref 13.0–17.0)

## 2019-02-12 LAB — FIBRINOGEN: Fibrinogen: 222 mg/dL (ref 210–475)

## 2019-02-12 LAB — BASIC METABOLIC PANEL
Anion gap: 7 (ref 5–15)
BUN: 13 mg/dL (ref 8–23)
CO2: 24 mmol/L (ref 22–32)
Calcium: 8.2 mg/dL — ABNORMAL LOW (ref 8.9–10.3)
Chloride: 111 mmol/L (ref 98–111)
Creatinine, Ser: 0.99 mg/dL (ref 0.61–1.24)
GFR calc Af Amer: >60 mL/min (ref >60)
GFR calc non Af Amer: >60 mL/min (ref >60)
Glucose, Bld: 120 mg/dL — ABNORMAL HIGH (ref 70–99)
Potassium: 4.9 mmol/L (ref 3.5–5.1)
Sodium: 142 mmol/L (ref 135–145)

## 2019-02-12 LAB — ECHO INTRAOPERATIVE TEE
Height: 72.008 in
Weight: 4048 oz

## 2019-02-12 LAB — PLATELET COUNT: Platelets: 135 10*3/uL — ABNORMAL LOW (ref 150–400)

## 2019-02-12 LAB — ECHOCARDIOGRAM COMPLETE
Height: 72 in
Weight: 4048 oz

## 2019-02-12 MED ORDER — ROCURONIUM BROMIDE 10 MG/ML (PF) SYRINGE
PREFILLED_SYRINGE | INTRAVENOUS | Status: AC
  Filled 2019-02-12: qty 40

## 2019-02-12 MED ORDER — INSULIN REGULAR(HUMAN) IN NACL 100-0.9 UT/100ML-% IV SOLN: INTRAVENOUS | Status: DC

## 2019-02-12 MED ORDER — ASPIRIN EC 325 MG PO TBEC
325.0000 mg | DELAYED_RELEASE_TABLET | Freq: Every day | ORAL | Status: DC
  Administered 2019-02-13 – 2019-02-17 (×5): 325 mg via ORAL
  Filled 2019-02-12 (×5): qty 1

## 2019-02-12 MED ORDER — LACTATED RINGERS IV SOLN: 500.0000 mL | Freq: Once | INTRAVENOUS | Status: DC | PRN

## 2019-02-12 MED ORDER — INSULIN DETEMIR 100 UNIT/ML ~~LOC~~ SOLN: 15.0000 [IU] | Freq: Two times a day (BID) | SUBCUTANEOUS | Status: DC

## 2019-02-12 MED ORDER — LACTATED RINGERS IV SOLN: INTRAVENOUS | Status: DC

## 2019-02-12 MED ORDER — MAGNESIUM SULFATE 4 GM/100ML IV SOLN
4.0000 g | Freq: Once | INTRAVENOUS | Status: AC
  Administered 2019-02-12: 4 g via INTRAVENOUS
  Filled 2019-02-12: qty 100

## 2019-02-12 MED ORDER — DEXTROSE 50 % IV SOLN: 0.0000 mL | INTRAVENOUS | Status: DC | PRN

## 2019-02-12 MED ORDER — BISACODYL 5 MG PO TBEC
10.0000 mg | DELAYED_RELEASE_TABLET | Freq: Every day | ORAL | Status: DC
  Administered 2019-02-13 – 2019-02-19 (×6): 10 mg via ORAL
  Filled 2019-02-12 (×6): qty 2

## 2019-02-12 MED ORDER — MILRINONE LACTATE IN DEXTROSE 20-5 MG/100ML-% IV SOLN
0.1250 ug/kg/min | INTRAVENOUS | Status: DC
  Administered 2019-02-12 – 2019-02-16 (×12): 0.3 ug/kg/min via INTRAVENOUS
  Administered 2019-02-17: 0.125 ug/kg/min via INTRAVENOUS
  Filled 2019-02-12 (×12): qty 100

## 2019-02-12 MED ORDER — PROTAMINE SULFATE 10 MG/ML IV SOLN
INTRAVENOUS | Status: DC | PRN
  Administered 2019-02-12: 300 mg via INTRAVENOUS

## 2019-02-12 MED ORDER — FUROSEMIDE 10 MG/ML IJ SOLN
40.0000 mg | Freq: Two times a day (BID) | INTRAMUSCULAR | Status: DC
  Administered 2019-02-12: 40 mg via INTRAVENOUS
  Filled 2019-02-12: qty 4

## 2019-02-12 MED ORDER — PANTOPRAZOLE SODIUM 40 MG PO TBEC
40.0000 mg | DELAYED_RELEASE_TABLET | Freq: Every day | ORAL | Status: DC
  Administered 2019-02-14 – 2019-02-19 (×6): 40 mg via ORAL
  Filled 2019-02-12 (×6): qty 1

## 2019-02-12 MED ORDER — INSULIN DETEMIR 100 UNIT/ML ~~LOC~~ SOLN
15.0000 [IU] | Freq: Two times a day (BID) | SUBCUTANEOUS | Status: DC
  Administered 2019-02-12 – 2019-02-16 (×10): 15 [IU] via SUBCUTANEOUS
  Filled 2019-02-12 (×12): qty 0.15

## 2019-02-12 MED ORDER — ACETAMINOPHEN 650 MG RE SUPP: 650.0000 mg | Freq: Once | RECTAL | Status: AC

## 2019-02-12 MED ORDER — LEVOFLOXACIN IN D5W 750 MG/150ML IV SOLN
750.0000 mg | INTRAVENOUS | Status: AC
  Administered 2019-02-13: 10:00:00 750 mg via INTRAVENOUS
  Filled 2019-02-12: qty 150

## 2019-02-12 MED ORDER — MIDAZOLAM HCL (PF) 10 MG/2ML IJ SOLN
INTRAMUSCULAR | Status: AC
  Filled 2019-02-12: qty 2

## 2019-02-12 MED ORDER — OXYCODONE HCL 5 MG PO TABS
5.0000 mg | ORAL_TABLET | ORAL | Status: DC | PRN
  Administered 2019-02-12 – 2019-02-19 (×32): 10 mg via ORAL
  Filled 2019-02-12 (×32): qty 2

## 2019-02-12 MED ORDER — FENTANYL CITRATE (PF) 100 MCG/2ML IJ SOLN
50.0000 ug | INTRAMUSCULAR | Status: DC | PRN
  Administered 2019-02-12 – 2019-02-16 (×14): 50 ug via INTRAVENOUS
  Filled 2019-02-12 (×14): qty 2

## 2019-02-12 MED ORDER — VANCOMYCIN HCL IN DEXTROSE 1-5 GM/200ML-% IV SOLN
1000.0000 mg | Freq: Once | INTRAVENOUS | Status: AC
  Administered 2019-02-12: 1000 mg via INTRAVENOUS
  Filled 2019-02-12: qty 200

## 2019-02-12 MED ORDER — SODIUM CHLORIDE 0.9 % IV SOLN: 250.0000 mL | INTRAVENOUS | Status: DC

## 2019-02-12 MED ORDER — FAMOTIDINE IN NACL 20-0.9 MG/50ML-% IV SOLN
20.0000 mg | Freq: Two times a day (BID) | INTRAVENOUS | Status: AC
  Administered 2019-02-12 (×2): 20 mg via INTRAVENOUS
  Filled 2019-02-12: qty 50

## 2019-02-12 MED ORDER — SODIUM CHLORIDE 0.9% FLUSH: 3.0000 mL | INTRAVENOUS | Status: DC | PRN

## 2019-02-12 MED ORDER — SODIUM CHLORIDE 0.45 % IV SOLN: INTRAVENOUS | Status: DC | PRN

## 2019-02-12 MED ORDER — DEXMEDETOMIDINE HCL IN NACL 400 MCG/100ML IV SOLN
0.0000 ug/kg/h | INTRAVENOUS | Status: DC
  Administered 2019-02-12 (×2): 0.7 ug/kg/h via INTRAVENOUS
  Filled 2019-02-12 (×2): qty 100

## 2019-02-12 MED ORDER — MIDAZOLAM HCL 2 MG/2ML IJ SOLN
2.0000 mg | INTRAMUSCULAR | Status: DC | PRN
  Administered 2019-02-12: 2 mg via INTRAVENOUS
  Filled 2019-02-12: qty 2

## 2019-02-12 MED ORDER — ACETAMINOPHEN 500 MG PO TABS
1000.0000 mg | ORAL_TABLET | Freq: Four times a day (QID) | ORAL | Status: DC
  Filled 2019-02-12: qty 2

## 2019-02-12 MED ORDER — TRAMADOL HCL 50 MG PO TABS
50.0000 mg | ORAL_TABLET | ORAL | Status: DC | PRN
  Administered 2019-02-12 – 2019-02-19 (×11): 100 mg via ORAL
  Filled 2019-02-12 (×11): qty 2

## 2019-02-12 MED ORDER — ACETAMINOPHEN 160 MG/5ML PO SOLN
1000.0000 mg | Freq: Four times a day (QID) | ORAL | Status: DC
  Filled 2019-02-12: qty 40.6

## 2019-02-12 MED ORDER — SODIUM CHLORIDE 0.9 % IV SOLN
25.0000 ug | INTRAVENOUS | Status: AC
  Administered 2019-02-12: 25 ug via INTRAVENOUS
  Filled 2019-02-12: qty 6.25

## 2019-02-12 MED ORDER — DEXTROSE 50 % IV SOLN
INTRAVENOUS | Status: AC
  Filled 2019-02-12: qty 50

## 2019-02-12 MED ORDER — FENTANYL CITRATE (PF) 250 MCG/5ML IJ SOLN
INTRAMUSCULAR | Status: AC
  Filled 2019-02-12: qty 10

## 2019-02-12 MED ORDER — CHLORHEXIDINE GLUCONATE 0.12 % MT SOLN
15.0000 mL | OROMUCOSAL | Status: AC
  Administered 2019-02-12: 15 mL via OROMUCOSAL

## 2019-02-12 MED ORDER — VASOPRESSIN 20 UNIT/ML IV SOLN
0.0300 [IU]/min | INTRAVENOUS | Status: DC
  Filled 2019-02-12: qty 2

## 2019-02-12 MED ORDER — NITROGLYCERIN IN D5W 200-5 MCG/ML-% IV SOLN
0.0000 ug/min | INTRAVENOUS | Status: DC
  Administered 2019-02-12: 5 ug/min via INTRAVENOUS

## 2019-02-12 MED ORDER — DEXMEDETOMIDINE HCL IN NACL 400 MCG/100ML IV SOLN
INTRAVENOUS | Status: DC | PRN
  Administered 2019-02-12: .4 ug/kg/h via INTRAVENOUS
  Administered 2019-02-12: 0.697 ug/kg/h via INTRAVENOUS

## 2019-02-12 MED ORDER — INSULIN ASPART 100 UNIT/ML ~~LOC~~ SOLN
0.0000 [IU] | SUBCUTANEOUS | Status: DC
  Administered 2019-02-12 – 2019-02-13 (×5): 2 [IU] via SUBCUTANEOUS

## 2019-02-12 MED ORDER — MIDAZOLAM HCL 2 MG/2ML IJ SOLN: 1.0000 mg | INTRAMUSCULAR | Status: DC | PRN

## 2019-02-12 MED ORDER — CALCIUM CHLORIDE 10 % IV SOLN
INTRAVENOUS | Status: DC | PRN
  Administered 2019-02-12 (×2): 250 mg via INTRAVENOUS
  Administered 2019-02-12: 500 mg via INTRAVENOUS

## 2019-02-12 MED ORDER — ACETAMINOPHEN 160 MG/5ML PO SOLN
650.0000 mg | Freq: Once | ORAL | Status: AC
  Administered 2019-02-12: 650 mg

## 2019-02-12 MED ORDER — MORPHINE SULFATE (PF) 2 MG/ML IV SOLN
1.0000 mg | INTRAVENOUS | Status: DC | PRN
  Administered 2019-02-12: 2 mg via INTRAVENOUS
  Filled 2019-02-12: qty 1
  Filled 2019-02-12: qty 2

## 2019-02-12 MED ORDER — METOPROLOL TARTRATE 12.5 MG HALF TABLET
12.5000 mg | ORAL_TABLET | Freq: Two times a day (BID) | ORAL | Status: DC
  Administered 2019-02-12: 12.5 mg via ORAL
  Filled 2019-02-12: qty 1

## 2019-02-12 MED ORDER — SODIUM CHLORIDE 0.9% FLUSH
10.0000 mL | Freq: Two times a day (BID) | INTRAVENOUS | Status: DC
  Administered 2019-02-12 – 2019-02-19 (×10): 10 mL

## 2019-02-12 MED ORDER — ASPIRIN 81 MG PO CHEW: 324.0000 mg | CHEWABLE_TABLET | Freq: Every day | ORAL | Status: DC

## 2019-02-12 MED ORDER — ALBUMIN HUMAN 5 % IV SOLN
250.0000 mL | INTRAVENOUS | Status: AC | PRN
  Administered 2019-02-12 (×3): 12.5 g via INTRAVENOUS
  Filled 2019-02-12 (×2): qty 250

## 2019-02-12 MED ORDER — ONDANSETRON HCL 4 MG/2ML IJ SOLN
4.0000 mg | Freq: Four times a day (QID) | INTRAMUSCULAR | Status: DC | PRN
  Administered 2019-02-12: 4 mg via INTRAVENOUS
  Filled 2019-02-12: qty 2

## 2019-02-12 MED ORDER — SODIUM CHLORIDE 0.9% FLUSH
3.0000 mL | Freq: Two times a day (BID) | INTRAVENOUS | Status: DC
  Administered 2019-02-13 – 2019-02-18 (×5): 3 mL via INTRAVENOUS

## 2019-02-12 MED ORDER — DOCUSATE SODIUM 100 MG PO CAPS
200.0000 mg | ORAL_CAPSULE | Freq: Every day | ORAL | Status: DC
  Administered 2019-02-13 – 2019-02-19 (×6): 200 mg via ORAL
  Filled 2019-02-12 (×7): qty 2

## 2019-02-12 MED ORDER — NOREPINEPHRINE 4 MG/250ML-% IV SOLN
0.0000 ug/min | INTRAVENOUS | Status: DC
  Administered 2019-02-12: 3 ug/min via INTRAVENOUS
  Administered 2019-02-12: 20 ug/min via INTRAVENOUS
  Filled 2019-02-12: qty 250

## 2019-02-12 MED ORDER — MILRINONE LACTATE IN DEXTROSE 20-5 MG/100ML-% IV SOLN
INTRAVENOUS | Status: DC | PRN
  Administered 2019-02-12: .25 ug/kg/min via INTRAVENOUS

## 2019-02-12 MED ORDER — BISACODYL 10 MG RE SUPP: 10.0000 mg | Freq: Every day | RECTAL | Status: DC

## 2019-02-12 MED ORDER — POTASSIUM CHLORIDE 10 MEQ/50ML IV SOLN: 10.0000 meq | INTRAVENOUS | Status: DC

## 2019-02-12 MED ORDER — CHLORHEXIDINE GLUCONATE CLOTH 2 % EX PADS
6.0000 | MEDICATED_PAD | Freq: Every day | CUTANEOUS | Status: DC
  Administered 2019-02-12 – 2019-02-16 (×5): 6 via TOPICAL

## 2019-02-12 MED ORDER — PHENYLEPHRINE HCL-NACL 10-0.9 MG/250ML-% IV SOLN
INTRAVENOUS | Status: AC
  Filled 2019-02-12: qty 250

## 2019-02-12 MED ORDER — SODIUM CHLORIDE 0.9% FLUSH: 10.0000 mL | INTRAVENOUS | Status: DC | PRN

## 2019-02-12 MED ORDER — SODIUM CHLORIDE 0.9 % IV SOLN: INTRAVENOUS | Status: DC

## 2019-02-12 MED ORDER — METOPROLOL TARTRATE 25 MG/10 ML ORAL SUSPENSION: 12.5000 mg | Freq: Two times a day (BID) | ORAL | Status: DC

## 2019-02-12 MED ORDER — METOPROLOL TARTRATE 5 MG/5ML IV SOLN: 2.5000 mg | INTRAVENOUS | Status: DC | PRN

## 2019-02-12 NOTE — Progress Notes (Signed)
[facility] BRIEF SICU PROGRESS NOTE  1 Day Post-Op  S/P Procedure(s) (LRB): EMERGENCY REDO STERNOTOMY (N/A) EMERGENCY REDO CORONARY ARTERY BYPASS GRAFTING (CABG) USING RIGHT GREATER SAPHENOUS VEIN HARVESTED ENDOSCOPICALLY (N/A) EMERGENCY REPAIR OF AORTIC PSEUDOANEURYSM (N/A) TRANSESOPHAGEAL ECHOCARDIOGRAM (TEE) (N/A)   Extubated uneventfully NSR w/ PAC's, stable hemodynamics on levophed and milrinone Breathing comfortably w/ O2 sats 90-94% UOP adequate  Plan: Continue routine early postop  Purcell Nails, MD 02/12/2019 4:54 PM

## 2019-02-12 NOTE — Brief Op Note (Signed)
02/11/2019  8:17 AM  PATIENT:  Evan Preston  64 y.o. male  PRE-OPERATIVE DIAGNOSIS:  RIGHT CORONARY ARTERY PSEUDOANEURYSM  POST-OPERATIVE DIAGNOSIS:  RIGHT CORONARY ARTERY PSEUDOANEURYSM  PROCEDURE:  Procedure(s): EMERGENCY REDO STERNOTOMY (N/A) EMERGENCY REDO CORONARY ARTERY BYPASS GRAFTING (CABG) USING RIGHT GREATER SAPHENOUS VEIN HARVESTED ENDOSCOPICALLY (N/A) EMERGENCY REPAIR OF AORTIC PSEUDOANEURYSM (N/A) TRANSESOPHAGEAL ECHOCARDIOGRAM (TEE) (N/A) SVG- SURGEON:  Surgeon(s) and Role:    Kerin Perna, MD - Primary  PHYSICIAN ASSISTANT: Letroy Vazguez PA-C  ANESTHESIA:   general  EBL:  1000 mL   BLOOD ADMINISTERED:SEE ANESTHESIA AND PERFUSION RECORDS  DRAINS: MEDIASTINAL AND PLEURAL CHEST TUBES   LOCAL MEDICATIONS USED:  NONE  SPECIMEN:  Source of Specimen:  PSEUDO-ANEURYSM CONTENTS  DISPOSITION OF SPECIMEN:  PATHOLOGY  COUNTS:  YES  TOURNIQUET:  * No tourniquets in log *  DICTATION: .Other Dictation: Dictation Number PENDING  PLAN OF CARE: Admit to inpatient   PATIENT DISPOSITION:  ICU - intubated and hemodynamically stable.   Delay start of Pharmacological VTE agent (>24hrs) due to surgical blood loss or risk of bleeding: yes

## 2019-02-12 NOTE — Plan of Care (Signed)
°  Problem: Cardiac: °Goal: Will achieve and/or maintain hemodynamic stability °Outcome: Progressing °  °

## 2019-02-12 NOTE — Progress Notes (Addendum)
Patient became increasingly agitated after being extubated. Vitals stable. Lungs clear, diminished. Patient stated he had a lot of generalized pain. Administered prn morphine without relief. Later administered prn oxycodone. Patient still c/o pain and shouting "something's wrong!" Dr. Donata Clay notified and stated he would come see patient. New prn pain orders received. It was determined patient has a duodenal ulcer that reacts poorly to tylenol, which he had received earlier today. Order discontinued. Will continue to monitor.  Leanna Battles, RN  Edited to add: Dr. Donata Clay stated it was ok to keep femoral A-line in place until tomorrow due to patient's pain/agitation.

## 2019-02-12 NOTE — Progress Notes (Signed)
1 Day Post-Op Procedure(s) (LRB): EMERGENCY REDO STERNOTOMY (N/A) EMERGENCY REDO CORONARY ARTERY BYPASS GRAFTING (CABG) USING RIGHT GREATER SAPHENOUS VEIN HARVESTED ENDOSCOPICALLY (N/A) EMERGENCY REPAIR OF AORTIC PSEUDOANEURYSM (N/A) TRANSESOPHAGEAL ECHOCARDIOGRAM (TEE) (N/A) Subjective: Sedated on vent with stable hemodynamics after emergency redo sternotomy for CABG and cardiac tamponade from large saphenous vein pseudoaneurysm compression of the right atrium and venous return  Sinus rhythm Mixed venous saturation 70% Minimal chest tube drainage  Objective: Vital signs in last 24 hours: Temp:  [97.2 F (36.2 C)-99.9 F (37.7 C)] 99.9 F (37.7 C) (01/21 1118) Pulse Rate:  [62-95] 76 (01/21 1118) Cardiac Rhythm: Atrial paced (01/21 0800) Resp:  [9-23] 18 (01/21 1118) BP: (86-158)/(62-114) 108/66 (01/21 1100) SpO2:  [91 %-100 %] 97 % (01/21 1118) Arterial Line BP: (88-145)/(51-100) 101/58 (01/21 1100) FiO2 (%):  [50 %-70 %] 50 % (01/21 1118) Weight:  [114.8 kg-122.1 kg] 122.1 kg (01/21 0500)  Hemodynamic parameters for last 24 hours: PAP: (24-44)/(4-20) 42/20 CVP:  [8 mmHg-11 mmHg] 11 mmHg CO:  [4.1 L/min-5.4 L/min] 5.1 L/min CI:  [1.7 L/min/m2-2.3 L/min/m2] 2.2 L/min/m2  Intake/Output from previous day: 01/20 0701 - 01/21 0700 In: 5995.1 [I.V.:3287.8; Blood:2051; IV Piggyback:656.3] Out: 2050 [Urine:1010; Blood:1000; Chest Tube:40] Intake/Output this shift: Total I/O In: 540.5 [I.V.:210.6; IV Piggyback:330] Out: 285 [Urine:215; Chest Tube:70]  Exam Sedated on vent Breath sounds distant but clear Heart rhythm regular out murmur Chest tube drainage less than 70 cc/h Extremities with mild edema Abdomen soft  Lab Results: Recent Labs    02/12/19 0345 02/12/19 0348 02/12/19 0600 02/12/19 0600 02/12/19 0611 02/12/19 0928  WBC 16.4*  --  13.1*  --   --   --   HGB 11.4*   < > 10.8*   < > 10.5* 9.2*  HCT 35.1*   < > 32.7*   < > 31.0* 27.0*  PLT 139*  --  121*  --    --   --    < > = values in this interval not displayed.   BMET:  Recent Labs    02/11/19 1203 02/11/19 1959 02/12/19 0600 02/12/19 0600 02/12/19 0611 02/12/19 0928  NA 141   < > 142   < > 144 143  K 4.5   < > 4.9   < > 5.2* 4.0  CL 104   < > 111  --   --  109  CO2 24  --  24  --   --   --   GLUCOSE 238*   < > 120*  --   --  137*  BUN 14   < > 13  --   --  13  CREATININE 1.08   < > 0.99  --   --  0.80  CALCIUM 8.8*  --  8.2*  --   --   --    < > = values in this interval not displayed.    PT/INR:  Recent Labs    02/12/19 0345  LABPROT 15.4*  INR 1.2   ABG    Component Value Date/Time   PHART 7.391 02/12/2019 0611   HCO3 28.8 (H) 02/12/2019 0611   TCO2 26 02/12/2019 0928   ACIDBASEDEF 1.0 02/12/2019 0155   O2SAT 98.0 02/12/2019 0611   CBG (last 3)  Recent Labs    02/12/19 0900 02/12/19 1004 02/12/19 1101  GLUCAP 134* 133* 129*    Assessment/Plan: S/P Procedure(s) (LRB): EMERGENCY REDO STERNOTOMY (N/A) EMERGENCY REDO CORONARY ARTERY BYPASS GRAFTING (CABG) USING RIGHT GREATER SAPHENOUS VEIN HARVESTED  ENDOSCOPICALLY (N/A) EMERGENCY REPAIR OF AORTIC PSEUDOANEURYSM (N/A) TRANSESOPHAGEAL ECHOCARDIOGRAM (TEE) (N/A) Mobilize Diabetes control Extubate with rapid wean protocol  DC femoral A-line   LOS: 1 day    Kathlee Nations Trigt III 02/12/2019

## 2019-02-12 NOTE — Procedures (Signed)
Extubation Procedure Note  Patient Details:   Name: Evan Preston DOB: 1955-11-10 MRN: [id number]   Airway Documentation:    Vent end date: 02/12/19 Vent end time: 1435   Evaluation  O2 sats: stable throughout Complications: No apparent complications Patient did tolerate procedure well. Bilateral Breath Sounds: Clear, Diminished   Yes   Pt was extubated per rapid wean protocol and placed on 4 L [place]. NIF was -32 and VC was 850 prior to extubation. Cuff leak was noted as well. Pt is stable at this time. Rt will monitor.   Merlene Laughter 02/12/2019, 2:37 PM

## 2019-02-12 NOTE — Transfer of Care (Signed)
Immediate Anesthesia Transfer of Care Note  Patient: Evan Preston  Procedure(s) Performed: EMERGENCY REDO STERNOTOMY (N/A ) EMERGENCY REDO CORONARY ARTERY BYPASS GRAFTING (CABG) USING RIGHT GREATER SAPHENOUS VEIN HARVESTED ENDOSCOPICALLY (N/A Chest) EMERGENCY REPAIR OF AORTIC PSEUDOANEURYSM (N/A ) TRANSESOPHAGEAL ECHOCARDIOGRAM (TEE) (N/A )  Patient Location: ICU  Anesthesia Type:General  Level of Consciousness: sedated and Patient remains intubated per anesthesia plan  Airway & Oxygen Therapy: Patient remains intubated per anesthesia plan and Patient placed on Ventilator (see vital sign flow sheet for setting)  Post-op Assessment: Report given to RN and Post -op Vital signs reviewed and stable  Post vital signs: Reviewed and stable  Last Vitals:  Vitals Value Taken Time  BP    Temp    Pulse    Resp 24 02/12/19 0335  SpO2    Vitals shown include unvalidated device data.  Last Pain: There were no vitals filed for this visit.       Complications: No apparent anesthesia complications

## 2019-02-12 NOTE — Op Note (Signed)
NAMEVERLIN, UHER MEDICAL RECORD [id number] ACCOUNT [id number] DATE OF BIRTH:08-30-1955 FACILITY: [facility] LOCATION: [facility] PHYSICIAN:Mima Cranmore VAN TRIGT III, MD  OPERATIVE REPORT  DATE OF PROCEDURE:  02/11/2019  OPERATION: 1.  Emergency redo sternotomy for repair of cardiac pseudoaneurysm involving the previously placed right coronary vein graft. 2.  Redo coronary artery bypass graft x1 using autologous reverse saphenous vein graft to the posterior descending. 3.  Endoscopic harvest of  right leg greater saphenous vein. 4.  Right axillary artery exposure for connection to cardiopulmonary bypass.  SURGEON:  Ivin Poot, MD  ASSISTANT:  Jadene Pierini, PA-C.  ANESTHESIA:  General by Dr. Annye Asa.  PREOPERATIVE DIAGNOSIS:  Severe chest pain from a large cardiac pseudoaneurysm involving the previously placed saphenous vein graft to the distal right coronary artery at the time of coronary artery bypass graft in 2002 at an outside hospital.  POSTOPERATIVE DIAGNOSIS:  Severe chest pain from a large cardiac pseudoaneurysm involving the previously placed saphenous vein graft to the distal right coronary artery at the time of coronary artery bypass graft in 2002 at an outside hospital.  CLINICAL NOTE:  The patient is a 64 year old obese male who presented to the ED with severe chest pain and history of previous CABG x4 in [place] in 2002.  His cardiac enzymes were mildly positive.  He underwent a CTA to evaluate for aortic dissection.   This showed no evidence of aortic dissection, but it did show a large 12 cm cardiac pseudoaneurysm compressing the right atrium and extending from the aortic root to the inferior vena cava.  A subsequent echocardiogram showed evidence of a tamponade  mass-like effect from the compression of this large pseudoaneurysm on the right atrium and right side of the heart.  The patient was prepared for emergency redo sternotomy and correction of the cardiac  pseudoaneurysm on cardiopulmonary bypass.  I  discussed the procedure in detail with the patient prior to surgery, including the use of general anesthesia, the location of the surgical incisions, the expected postoperative recovery and the potential risk including the risk of stroke, bleeding, organ  failure, infection and death.  The patient demonstrated his understanding and agreed to proceed with surgery under what I felt was informed consent.  OPERATIVE FINDINGS:   Large cardiac pseudoaneurysm involving the vein graft from the previous aortocoronary bypass.  This pseudoaneurysm measured 12 cm in diameter and had chronic laminated clot, also with some calcification that was closely involved  with the origin of the vein graft at the ascending aorta, but the distal portion of the vein graft was uninvolved.  The dissection to expose the right side of the heart for venous cannulation was difficult because of the obstruction of the large  pseudoaneurysm, but a portion of the superior vena cava and inferior vena cava were accessed for cannulation for bypass.  DESCRIPTION OF PROCEDURE:  The patient was brought from the ICU to the OR where invasive hemodynamic monitoring lines were placed and the patient was placed on the OR table.  General anesthesia was induced.  There was a drop in blood pressure probably  related to the tamponade effect of the large pseudoaneurysm on the right side of the heart and prevention of right heart filling.  The patient was resuscitated by the anesthesia team and stabilized.  A transesophageal echo probe was placed which  confirmed the preoperative diagnosis of a large cardiac pseudoaneurysm.  The patient was prepped and draped as a sterile field.  A proper time-out was performed.  A femoral A-line was placed in the left femoral artery because of difficulty with blood  pressure monitoring from the radial artery line.  An incision was made beneath the right clavicle and the  pectoralis muscle fibers were split and retracted.  The pectoralis minor muscle was retracted and the axillary artery was carefully dissected from the brachial plexus cords.  A vessel loop was  surrounded and 3000 units of heparin were administered.  Vascular clamps were placed proximally and distally and an 8 mm Gelweave graft was sewn end-to-side with running 5-0 Prolene for arterial input from cardiopulmonary bypass.  This was clamped off  after the vascular clamps were removed.  Next, a redo sternotomy was performed.  The previous wires were removed.  The sternum was divided using an oscillating saw, with care being taken to avoid injury to the underlying vascular structures.  The anterior mediastinum was involved with an  extremely dense extensive adhesion situation.  The sternal lower tables were carefully dissected off the mediastinal soft tissue.  A retractor was placed.  A dissection plane was attempted to expose the right side of the heart; however, this was  completely encased with a very firm, immobile pseudoaneurysm that could have been related to the ascending aorta with significant blood loss if entered.  For that reason, a Rultract retractor was placed on the left side and dissection of the left side and anteroinferior aspect of the heart was performed.  The ascending aorta was gradually dissected out and the previously placed vein grafts were identified  and preserved.  There were 2 vein grafts extending to the left side of the heart and these were somewhat diseased, but patent.  The vein graft to the right coronary was patent, but immediately after the anastomosis entered this large pseudoaneurysm.  After further tedious dissection, the right border of the heart was dissected enough to expose the anterior surface of the inferior vena cava and superior vena cava.  It should be noted that the patient had had a previous cava filter placed for DVT and the cava filter was still in place  just above the renal veins, which made access to the right atrium from the groin not possible.  Pursestrings were placed in the exposed area of the inferior vena cava and the superior vena cava just beyond the margin of the pseudoaneurysm.  Heparin was administered.  The arterial graft was connected to the arterial circuit from cardiopulmonary  bypass and 2 venous cannulas were placed in the superior vena cava and the inferior vena cava and those were connected to the circuit of the cardiopulmonary bypass.  The patient was then placed on cardiopulmonary bypass and cooled to 32 degrees.  The remainder of the dissection was performed as much as possible over the anterior surface of the heart and to allow placement of an aortic clamp.  A cardioplegia cannula was placed in the ascending aorta.  The pseudoaneurysm at this point on bypass did  not show evidence of pulsation.  The patient was cooled to 32 degrees and the aortic crossclamp was applied.  One L of cold blood cardioplegia was delivered into the aortic root with good cardioplegic arrest and supple temperature dropped less than 14 degrees.  Cardioplegia was  delivered every 20 minutes.  CO2 was insufflated into the operative field.  The pseudoaneurysm was entered with a scalpel.  There was no blood under pressure, but there was a large amount of laminated clot and a capsule as well.  The  pseudoaneurysm measured 12 cm.  The right atrial  lateral wall was visible at the base of the aneurysm.  There appeared to be some blood from the right coronary graft which entered the pseudoaneurysm cavity when cardioplegia was delivered to the aortic root.  After cleaning out the pseudoaneurysm and  making sure there was no opening in the right atrium, the origin of the right coronary graft on the aorta was oversewn with 3-0 Prolene sutures to exclude any communication between that proximal anastomosis and now an opened pseudoaneurysm capsule.  The  pseudoaneurysm was irrigated and the atrial wall appeared to be a fairly normal, but somewhat compressed.  Similarly, the distal part of the vein graft as it exited the pseudoaneurysm capsule was doubly ligated and divided to eliminate any backflow  into the pseudoaneurysm cavity.  Next, a piece of saphenous vein which had been harvested from the right leg using endoscopic technique was brought to the field and flushed.  An arteriotomy was made in the vein graft to the distal RCA and the graft was patent.  The saphenous vein was  then sewn end-to-side with running 6-0 Prolene to the patent distal vein graft and flushed and there was good flow through the graft.  After cardioplegia, the vein was trimmed to the appropriate length and sewn to the ascending aorta beneath the  crossclamp using a 4.5 mm punch and running 6-0 Prolene.  Prior to removing the crossclamp, air was vented from the coronary anastomosis with the usual de-airing maneuvers and the crossclamp was removed.  The heart resumed a spontaneous rhythm.  The anastomoses of the proximal and distal new saphenous vein graft were checked and found to be hemostatic.  There was no bleeding into the now exposed  pseudoaneurysm cavity from the aortic root or from the  distal lower aspect of the previously placed right coronary vein graft.  The patient was reperfused and rewarmed.  Temporary pacing wires were applied.  Low dose inotropes were started.  The lungs were expanded and the ventilator was resumed.  The patient was slowly weaned off cardiopulmonary bypass under TEE guidance with  good hemodynamics and preserved LV and RV systolic function.  Protamine was administered without adverse reaction.  There is still considerable coagulopathy from the dissection of the difficult redo mediastinal operative field.  Platelets and FFP were  transfused with improved coagulation function.  Bilateral pleural tubes and anterior mediastinal tubes were placed and  brought out through separate incisions.  The superior pericardial fat was closed over the aorta and vein graft.  The sternum was closed  with interrupted steel wire.  The pectoralis fascia was closed with a running #1 Vicryl.  The subcutaneous and skin layers were closed with running Vicryl and sterile dressings were applied.  Total cardiopulmonary bypass time was 100 minutes.  VN/NUANCE  D:02/12/2019 T:02/12/2019 JOB:[id number]

## 2019-02-13 ENCOUNTER — Inpatient Hospital Stay (HOSPITAL_COMMUNITY): Payer: 59

## 2019-02-13 LAB — POCT I-STAT 7, (LYTES, BLD GAS, ICA,H+H)
Acid-Base Excess: 2 mmol/L (ref 0.0–2.0)
Acid-Base Excess: 1 mmol/L (ref 0.0–2.0)
Bicarbonate: 27.4 mmol/L (ref 20.0–28.0)
Bicarbonate: 27.9 mmol/L (ref 20.0–28.0)
Calcium, Ion: 1.1 mmol/L — ABNORMAL LOW (ref 1.15–1.40)
Calcium, Ion: 1.12 mmol/L — ABNORMAL LOW (ref 1.15–1.40)
HCT: 31 % — ABNORMAL LOW (ref 39.0–52.0)
HCT: 28 % — ABNORMAL LOW (ref 39.0–52.0)
Hemoglobin: 10.5 g/dL — ABNORMAL LOW (ref 13.0–17.0)
Hemoglobin: 9.5 g/dL — ABNORMAL LOW (ref 13.0–17.0)
O2 Saturation: 94 %
O2 Saturation: 96 %
Patient temperature: 38.1
Patient temperature: 98.8
Potassium: 4.3 mmol/L (ref 3.5–5.1)
Potassium: 4.2 mmol/L (ref 3.5–5.1)
Sodium: 142 mmol/L (ref 135–145)
Sodium: 134 mmol/L — ABNORMAL LOW (ref 135–145)
TCO2: 29 mmol/L (ref 22–32)
TCO2: 30 mmol/L (ref 22–32)
pCO2 arterial: 48.5 mmHg — ABNORMAL HIGH (ref 32.0–48.0)
pCO2 arterial: 57.9 mmHg — ABNORMAL HIGH (ref 32.0–48.0)
pH, Arterial: 7.365 (ref 7.350–7.450)
pH, Arterial: 7.292 — ABNORMAL LOW (ref 7.350–7.450)
pO2, Arterial: 79 mmHg — ABNORMAL LOW (ref 83.0–108.0)
pO2, Arterial: 92 mmHg (ref 83.0–108.0)

## 2019-02-13 LAB — CBC
HCT: 34.1 % — ABNORMAL LOW (ref 39.0–52.0)
HCT: 30.1 % — ABNORMAL LOW (ref 39.0–52.0)
Hemoglobin: 10.6 g/dL — ABNORMAL LOW (ref 13.0–17.0)
Hemoglobin: 9.5 g/dL — ABNORMAL LOW (ref 13.0–17.0)
MCH: 30.8 pg (ref 26.0–34.0)
MCH: 31.6 pg (ref 26.0–34.0)
MCHC: 31.1 g/dL (ref 30.0–36.0)
MCHC: 31.6 g/dL (ref 30.0–36.0)
MCV: 99.1 fL (ref 80.0–100.0)
MCV: 100 fL (ref 80.0–100.0)
Platelets: 108 10*3/uL — ABNORMAL LOW (ref 150–400)
Platelets: 105 10*3/uL — ABNORMAL LOW (ref 150–400)
RBC: 3.44 MIL/uL — ABNORMAL LOW (ref 4.22–5.81)
RBC: 3.01 MIL/uL — ABNORMAL LOW (ref 4.22–5.81)
RDW: 14.5 % (ref 11.5–15.5)
RDW: 13.9 % (ref 11.5–15.5)
WBC: 14.5 10*3/uL — ABNORMAL HIGH (ref 4.0–10.5)
WBC: 15.4 10*3/uL — ABNORMAL HIGH (ref 4.0–10.5)
nRBC: 0 % (ref 0.0–0.2)
nRBC: 0 % (ref 0.0–0.2)

## 2019-02-13 LAB — BASIC METABOLIC PANEL
Anion gap: 8 (ref 5–15)
Anion gap: 7 (ref 5–15)
BUN: 15 mg/dL (ref 8–23)
BUN: 19 mg/dL (ref 8–23)
CO2: 27 mmol/L (ref 22–32)
CO2: 26 mmol/L (ref 22–32)
Calcium: 7.7 mg/dL — ABNORMAL LOW (ref 8.9–10.3)
Calcium: 7.4 mg/dL — ABNORMAL LOW (ref 8.9–10.3)
Chloride: 104 mmol/L (ref 98–111)
Chloride: 99 mmol/L (ref 98–111)
Creatinine, Ser: 1.18 mg/dL (ref 0.61–1.24)
Creatinine, Ser: 1.11 mg/dL (ref 0.61–1.24)
GFR calc Af Amer: >60 mL/min (ref >60)
GFR calc Af Amer: >60 mL/min (ref >60)
GFR calc non Af Amer: >60 mL/min (ref >60)
GFR calc non Af Amer: >60 mL/min (ref >60)
Glucose, Bld: 125 mg/dL — ABNORMAL HIGH (ref 70–99)
Glucose, Bld: 168 mg/dL — ABNORMAL HIGH (ref 70–99)
Potassium: 4.2 mmol/L (ref 3.5–5.1)
Potassium: 4.2 mmol/L (ref 3.5–5.1)
Sodium: 139 mmol/L (ref 135–145)
Sodium: 132 mmol/L — ABNORMAL LOW (ref 135–145)

## 2019-02-13 LAB — BPAM FFP
Blood Product Expiration Date: 202101242359
Blood Product Expiration Date: 202101242359
ISSUE DATE / TIME: 202101210024
ISSUE DATE / TIME: 202101210024
Unit Type and Rh: 6200
Unit Type and Rh: 6200

## 2019-02-13 LAB — PREPARE PLATELET PHERESIS
Unit division: 0
Unit division: 0

## 2019-02-13 LAB — PREPARE FRESH FROZEN PLASMA
Unit division: 0
Unit division: 0

## 2019-02-13 LAB — COOXEMETRY PANEL
Carboxyhemoglobin: 1.2 % (ref 0.5–1.5)
Carboxyhemoglobin: 1.3 % (ref 0.5–1.5)
Carboxyhemoglobin: 1.8 % — ABNORMAL HIGH (ref 0.5–1.5)
Methemoglobin: 0.5 % (ref 0.0–1.5)
Methemoglobin: 0.6 % (ref 0.0–1.5)
Methemoglobin: 1.1 % (ref 0.0–1.5)
O2 Saturation: 67.8 %
O2 Saturation: 97.1 %
O2 Saturation: 69 %
Total hemoglobin: 10 g/dL — ABNORMAL LOW (ref 12.0–16.0)
Total hemoglobin: 10 g/dL — ABNORMAL LOW (ref 12.0–16.0)
Total hemoglobin: 9.6 g/dL — ABNORMAL LOW (ref 12.0–16.0)

## 2019-02-13 LAB — GLUCOSE, CAPILLARY
Glucose-Capillary: 107 mg/dL — ABNORMAL HIGH (ref 70–99)
Glucose-Capillary: 125 mg/dL — ABNORMAL HIGH (ref 70–99)
Glucose-Capillary: 126 mg/dL — ABNORMAL HIGH (ref 70–99)
Glucose-Capillary: 138 mg/dL — ABNORMAL HIGH (ref 70–99)
Glucose-Capillary: 155 mg/dL — ABNORMAL HIGH (ref 70–99)
Glucose-Capillary: 160 mg/dL — ABNORMAL HIGH (ref 70–99)

## 2019-02-13 LAB — BPAM PLATELET PHERESIS
Blood Product Expiration Date: 202101212359
Blood Product Expiration Date: 202101232359
ISSUE DATE / TIME: 202101210023
ISSUE DATE / TIME: 202101210023
Unit Type and Rh: 600
Unit Type and Rh: 6200

## 2019-02-13 LAB — MAGNESIUM: Magnesium: 1.8 mg/dL (ref 1.7–2.4)

## 2019-02-13 LAB — SURGICAL PATHOLOGY

## 2019-02-13 MED ORDER — ALBUMIN HUMAN 25 % IV SOLN
12.5000 g | Freq: Four times a day (QID) | INTRAVENOUS | Status: AC
  Administered 2019-02-14 – 2019-02-15 (×3): 12.5 g via INTRAVENOUS
  Filled 2019-02-13 (×3): qty 50

## 2019-02-13 MED ORDER — METOCLOPRAMIDE HCL 5 MG/ML IJ SOLN
10.0000 mg | Freq: Four times a day (QID) | INTRAMUSCULAR | Status: DC
  Administered 2019-02-13 – 2019-02-16 (×12): 10 mg via INTRAVENOUS
  Filled 2019-02-13 (×13): qty 2

## 2019-02-13 MED ORDER — LEVALBUTEROL HCL 1.25 MG/0.5ML IN NEBU
1.2500 mg | INHALATION_SOLUTION | Freq: Four times a day (QID) | RESPIRATORY_TRACT | Status: DC
  Administered 2019-02-13 – 2019-02-17 (×15): 1.25 mg via RESPIRATORY_TRACT
  Filled 2019-02-13 (×17): qty 0.5

## 2019-02-13 MED ORDER — ALBUMIN HUMAN 5 % IV SOLN
12.5000 g | Freq: Once | INTRAVENOUS | Status: AC
  Administered 2019-02-13: 12.5 g via INTRAVENOUS

## 2019-02-13 MED ORDER — CARVEDILOL 12.5 MG PO TABS
12.5000 mg | ORAL_TABLET | Freq: Two times a day (BID) | ORAL | Status: DC
  Administered 2019-02-13 – 2019-02-19 (×13): 12.5 mg via ORAL
  Filled 2019-02-13 (×13): qty 1

## 2019-02-13 MED ORDER — ENOXAPARIN SODIUM 40 MG/0.4ML ~~LOC~~ SOLN
40.0000 mg | SUBCUTANEOUS | Status: DC
  Administered 2019-02-13 – 2019-02-18 (×6): 40 mg via SUBCUTANEOUS
  Filled 2019-02-13 (×6): qty 0.4

## 2019-02-13 MED ORDER — SODIUM CHLORIDE 0.9 % IV SOLN
INTRAVENOUS | Status: DC | PRN
  Administered 2019-02-13: 10:00:00 500 mL via INTRAVENOUS

## 2019-02-13 MED ORDER — FUROSEMIDE 10 MG/ML IJ SOLN
8.0000 mg/h | INTRAVENOUS | Status: DC
  Administered 2019-02-13 – 2019-02-16 (×3): 8 mg/h via INTRAVENOUS
  Filled 2019-02-13 (×4): qty 25

## 2019-02-13 MED ORDER — INSULIN ASPART 100 UNIT/ML ~~LOC~~ SOLN: 0.0000 [IU] | Freq: Every day | SUBCUTANEOUS | Status: DC

## 2019-02-13 MED ORDER — INSULIN ASPART 100 UNIT/ML ~~LOC~~ SOLN
0.0000 [IU] | Freq: Three times a day (TID) | SUBCUTANEOUS | Status: DC
  Administered 2019-02-13: 11:00:00 3 [IU] via SUBCUTANEOUS
  Administered 2019-02-14 – 2019-02-15 (×3): 2 [IU] via SUBCUTANEOUS
  Administered 2019-02-15: 3 [IU] via SUBCUTANEOUS
  Administered 2019-02-16: 12:00:00 2 [IU] via SUBCUTANEOUS
  Administered 2019-02-17: 3 [IU] via SUBCUTANEOUS

## 2019-02-13 NOTE — Progress Notes (Signed)
Dr. Dorris Fetch notified of patient's ABG results - pH 7.292 and CO2 57.9. Patient with sats 90-91% on 12 L HFNC. Patient awake and alert.  Leanna Battles, RN

## 2019-02-13 NOTE — Anesthesia Postprocedure Evaluation (Signed)
Anesthesia Post Note  Patient: Tao Satz  Procedure(s) Performed: EMERGENCY REDO STERNOTOMY (N/A ) EMERGENCY REDO CORONARY ARTERY BYPASS GRAFTING (CABG) USING RIGHT GREATER SAPHENOUS VEIN HARVESTED ENDOSCOPICALLY (N/A Chest) EMERGENCY REPAIR OF AORTIC PSEUDOANEURYSM (N/A ) TRANSESOPHAGEAL ECHOCARDIOGRAM (TEE) (N/A )     Patient location during evaluation: SICU Anesthesia Type: General Level of consciousness: awake and alert, patient cooperative and oriented Pain management: pain level controlled Vital Signs Assessment: post-procedure vital signs reviewed and stable Respiratory status: spontaneous breathing, nonlabored ventilation, respiratory function stable and patient connected to nasal cannula oxygen Cardiovascular status: blood pressure returned to baseline and stable Postop Assessment: no apparent nausea or vomiting, adequate PO intake and able to ambulate Anesthetic complications: no    Last Vitals:  Vitals:   02/13/19 1300 02/13/19 1400  BP: 116/61 104/67  Pulse: 87 82  Resp: (!) 29 (!) 25  Temp:    SpO2: 91% 90%    Last Pain:  Vitals:   02/13/19 1125  TempSrc: Oral  PainSc:                  Hudsen Fei,E. Kahlani Graber

## 2019-02-13 NOTE — Progress Notes (Signed)
      [address]       [place] [address]             [phone]      Up in chair, sleeping  BP 104/67   Pulse 82   Temp 98.8 F (37.1 C) (Oral)   Resp (!) 25   Ht 6' 0.01" (1.829 m)   Wt 118.4 kg   SpO2 90%   BMI 35.39 kg/m  91% sat on 12 L HFNC Milrinone at 0.3   Intake/Output Summary (Last 24 hours) at 02/13/2019 1726 Last data filed at 02/13/2019 1548 Gross per 24 hour  Intake 1458.24 ml  Output 2240 ml  Net -781.76 ml   CBG well controlled PM labs pending  Viviann Spare C. Dorris Fetch, MD [facility] [phone]

## 2019-02-13 NOTE — Progress Notes (Signed)
2 Days Post-Op Procedure(s) (LRB): EMERGENCY REDO STERNOTOMY (N/A) EMERGENCY REDO CORONARY ARTERY BYPASS GRAFTING (CABG) USING RIGHT GREATER SAPHENOUS VEIN HARVESTED ENDOSCOPICALLY (N/A) EMERGENCY REPAIR OF AORTIC PSEUDOANEURYSM (N/A) TRANSESOPHAGEAL ECHOCARDIOGRAM (TEE) (N/A) Subjective: BP, cardiac output better but O2 sats lower CXR low lung volumes NSR Objective: Vital signs in last 24 hours: Temp:  [99.1 F (37.3 C)-101.3 F (38.5 C)] 99.9 F (37.7 C) (01/22 0745) Pulse Rate:  [62-105] 92 (01/22 0745) Cardiac Rhythm: Normal sinus rhythm (01/22 0745) Resp:  [11-37] 27 (01/22 0745) BP: (88-126)/(51-105) 107/63 (01/22 0730) SpO2:  [84 %-100 %] 91 % (01/22 0745) Arterial Line BP: (89-173)/(50-71) 124/61 (01/22 0130) FiO2 (%):  [40 %-50 %] 40 % (01/21 1345) Weight:  [118.4 kg] 118.4 kg (01/22 0615)  Hemodynamic parameters for last 24 hours: PAP: (28-76)/(4-49) 58/27 CO:  [4.7 L/min-8.8 L/min] 7.6 L/min CI:  [2 L/min/m2-3.8 L/min/m2] 3.2 L/min/m2  Intake/Output from previous day: 01/21 0701 - 01/22 0700 In: 1554 [I.V.:1210.3; IV Piggyback:343.7] Out: 2030 [Urine:1600; Chest Tube:430] Intake/Output this shift: No intake/output data recorded.  Distant breath sounds No murmur  Lab Results: Recent Labs    02/12/19 0600 02/12/19 0611 02/13/19 0116 02/13/19 0352  WBC 13.1*  --   --  14.5*  HGB 10.8*   < > 10.5* 10.6*  HCT 32.7*   < > 31.0* 34.1*  PLT 121*  --   --  108*   < > = values in this interval not displayed.   BMET:  Recent Labs    02/12/19 0600 02/12/19 0611 02/12/19 0928 02/12/19 1418 02/13/19 0116 02/13/19 0352  NA 142   < > 143   < > 142 139  K 4.9   < > 4.0   < > 4.3 4.2  CL 111   < > 109  --   --  104  CO2 24  --   --   --   --  27  GLUCOSE 120*   < > 137*  --   --  125*  BUN 13   < > 13  --   --  15  CREATININE 0.99   < > 0.80  --   --  1.18  CALCIUM 8.2*  --   --   --   --  7.7*   < > = values in this interval not displayed.    PT/INR:   Recent Labs    02/12/19 0345  LABPROT 15.4*  INR 1.2   ABG    Component Value Date/Time   PHART 7.365 02/13/2019 0116   HCO3 27.4 02/13/2019 0116   TCO2 29 02/13/2019 0116   ACIDBASEDEF 4.0 (H) 02/12/2019 1549   O2SAT 67.8 02/13/2019 0319   CBG (last 3)  Recent Labs    02/13/19 0000 02/13/19 0348 02/13/19 0750  GLUCAP 138* 126* 125*    Assessment/Plan: S/P Procedure(s) (LRB): EMERGENCY REDO STERNOTOMY (N/A) EMERGENCY REDO CORONARY ARTERY BYPASS GRAFTING (CABG) USING RIGHT GREATER SAPHENOUS VEIN HARVESTED ENDOSCOPICALLY (N/A) EMERGENCY REPAIR OF AORTIC PSEUDOANEURYSM (N/A) TRANSESOPHAGEAL ECHOCARDIOGRAM (TEE) (N/A) Mobilize Diuresis Diabetes control DC swan   LOS: 2 days    Evan Preston 02/13/2019

## 2019-02-14 ENCOUNTER — Inpatient Hospital Stay (HOSPITAL_COMMUNITY): Payer: 59

## 2019-02-14 LAB — COMPREHENSIVE METABOLIC PANEL
ALT: 38 U/L (ref 0–44)
AST: 150 U/L — ABNORMAL HIGH (ref 15–41)
Albumin: 3 g/dL — ABNORMAL LOW (ref 3.5–5.0)
Alkaline Phosphatase: 49 U/L (ref 38–126)
Anion gap: 8 (ref 5–15)
BUN: 18 mg/dL (ref 8–23)
CO2: 30 mmol/L (ref 22–32)
Calcium: 7.8 mg/dL — ABNORMAL LOW (ref 8.9–10.3)
Chloride: 97 mmol/L — ABNORMAL LOW (ref 98–111)
Creatinine, Ser: 1.24 mg/dL (ref 0.61–1.24)
GFR calc Af Amer: >60 mL/min (ref >60)
GFR calc non Af Amer: >60 mL/min (ref >60)
Glucose, Bld: 120 mg/dL — ABNORMAL HIGH (ref 70–99)
Potassium: 3.7 mmol/L (ref 3.5–5.1)
Sodium: 135 mmol/L (ref 135–145)
Total Bilirubin: 0.9 mg/dL (ref 0.3–1.2)
Total Protein: 5.6 g/dL — ABNORMAL LOW (ref 6.5–8.1)

## 2019-02-14 LAB — POCT I-STAT 7, (LYTES, BLD GAS, ICA,H+H)
Acid-Base Excess: 2 mmol/L (ref 0.0–2.0)
Bicarbonate: 29 mmol/L — ABNORMAL HIGH (ref 20.0–28.0)
Calcium, Ion: 1.1 mmol/L — ABNORMAL LOW (ref 1.15–1.40)
HCT: 26 % — ABNORMAL LOW (ref 39.0–52.0)
Hemoglobin: 8.8 g/dL — ABNORMAL LOW (ref 13.0–17.0)
O2 Saturation: 95 %
Patient temperature: 98.4
Potassium: 3.6 mmol/L (ref 3.5–5.1)
Sodium: 135 mmol/L (ref 135–145)
TCO2: 31 mmol/L (ref 22–32)
pCO2 arterial: 54.6 mmHg — ABNORMAL HIGH (ref 32.0–48.0)
pH, Arterial: 7.333 — ABNORMAL LOW (ref 7.350–7.450)
pO2, Arterial: 80 mmHg — ABNORMAL LOW (ref 83.0–108.0)

## 2019-02-14 LAB — CBC
HCT: 28.9 % — ABNORMAL LOW (ref 39.0–52.0)
Hemoglobin: 9.2 g/dL — ABNORMAL LOW (ref 13.0–17.0)
MCH: 31.4 pg (ref 26.0–34.0)
MCHC: 31.8 g/dL (ref 30.0–36.0)
MCV: 98.6 fL (ref 80.0–100.0)
Platelets: 104 10*3/uL — ABNORMAL LOW (ref 150–400)
RBC: 2.93 MIL/uL — ABNORMAL LOW (ref 4.22–5.81)
RDW: 13.5 % (ref 11.5–15.5)
WBC: 14.4 10*3/uL — ABNORMAL HIGH (ref 4.0–10.5)
nRBC: 0 % (ref 0.0–0.2)

## 2019-02-14 LAB — GLUCOSE, CAPILLARY
Glucose-Capillary: 132 mg/dL — ABNORMAL HIGH (ref 70–99)
Glucose-Capillary: 125 mg/dL — ABNORMAL HIGH (ref 70–99)
Glucose-Capillary: 114 mg/dL — ABNORMAL HIGH (ref 70–99)
Glucose-Capillary: 125 mg/dL — ABNORMAL HIGH (ref 70–99)

## 2019-02-14 LAB — COOXEMETRY PANEL
Carboxyhemoglobin: 1.6 % — ABNORMAL HIGH (ref 0.5–1.5)
Carboxyhemoglobin: 1 % (ref 0.5–1.5)
Methemoglobin: 1.2 % (ref 0.0–1.5)
Methemoglobin: 0.5 % (ref 0.0–1.5)
O2 Saturation: 71.2 %
O2 Saturation: 59.6 %
Total hemoglobin: 9.3 g/dL — ABNORMAL LOW (ref 12.0–16.0)
Total hemoglobin: 9 g/dL — ABNORMAL LOW (ref 12.0–16.0)

## 2019-02-14 MED ORDER — BUDESONIDE 0.5 MG/2ML IN SUSP
0.5000 mg | Freq: Two times a day (BID) | RESPIRATORY_TRACT | Status: DC
  Administered 2019-02-14 – 2019-02-19 (×11): 0.5 mg via RESPIRATORY_TRACT
  Filled 2019-02-14 (×11): qty 2

## 2019-02-14 MED ORDER — AMIODARONE HCL IN DEXTROSE 360-4.14 MG/200ML-% IV SOLN
INTRAVENOUS | Status: AC
  Administered 2019-02-14: 16:00:00 60 mg/h via INTRAVENOUS
  Filled 2019-02-14: qty 200

## 2019-02-14 MED ORDER — POTASSIUM CHLORIDE CRYS ER 20 MEQ PO TBCR
20.0000 meq | EXTENDED_RELEASE_TABLET | ORAL | Status: AC
  Administered 2019-02-14 (×3): 20 meq via ORAL
  Filled 2019-02-14 (×3): qty 1

## 2019-02-14 MED ORDER — OXYBUTYNIN CHLORIDE ER 5 MG PO TB24
5.0000 mg | ORAL_TABLET | Freq: Every day | ORAL | Status: DC
  Administered 2019-02-14 – 2019-02-19 (×6): 5 mg via ORAL
  Filled 2019-02-14 (×6): qty 1

## 2019-02-14 MED ORDER — AMIODARONE HCL IN DEXTROSE 360-4.14 MG/200ML-% IV SOLN
30.0000 mg/h | INTRAVENOUS | Status: DC
  Administered 2019-02-14 – 2019-02-16 (×4): 30 mg/h via INTRAVENOUS
  Filled 2019-02-14 (×4): qty 200

## 2019-02-14 MED ORDER — GABAPENTIN 300 MG PO CAPS
300.0000 mg | ORAL_CAPSULE | Freq: Every day | ORAL | Status: DC
  Administered 2019-02-14 – 2019-02-18 (×5): 300 mg via ORAL
  Filled 2019-02-14 (×5): qty 1

## 2019-02-14 MED ORDER — AMIODARONE HCL IN DEXTROSE 360-4.14 MG/200ML-% IV SOLN
60.0000 mg/h | INTRAVENOUS | Status: AC
  Administered 2019-02-14: 60 mg/h via INTRAVENOUS
  Filled 2019-02-14: qty 200

## 2019-02-14 MED ORDER — POTASSIUM CHLORIDE 10 MEQ/100ML IV SOLN
10.0000 meq | INTRAVENOUS | Status: AC
  Administered 2019-02-14 (×2): 10 meq via INTRAVENOUS
  Filled 2019-02-14 (×2): qty 100

## 2019-02-14 MED ORDER — SODIUM CHLORIDE 0.9% FLUSH: 10.0000 mL | INTRAVENOUS | Status: DC | PRN

## 2019-02-14 MED ORDER — AMIODARONE IV BOLUS ONLY 150 MG/100ML
150.0000 mg | Freq: Once | INTRAVENOUS | Status: AC
  Administered 2019-02-14: 16:00:00 150 mg via INTRAVENOUS

## 2019-02-14 NOTE — Progress Notes (Signed)
3 Days Post-Op Procedure(s) (LRB): EMERGENCY REDO STERNOTOMY (N/A) EMERGENCY REDO CORONARY ARTERY BYPASS GRAFTING (CABG) USING RIGHT GREATER SAPHENOUS VEIN HARVESTED ENDOSCOPICALLY (N/A) EMERGENCY REPAIR OF AORTIC PSEUDOANEURYSM (N/A) TRANSESOPHAGEAL ECHOCARDIOGRAM (TEE) (N/A) Subjective: Up in chair, denies pain, still with some subjective shortness of breath  Objective: Vital signs in last 24 hours: Temp:  [98.3 F (36.8 C)-99.9 F (37.7 C)] 99.2 F (37.3 C) (01/23 0726) Pulse Rate:  [76-94] 81 (01/23 0000) Cardiac Rhythm: Normal sinus rhythm (01/23 0400) Resp:  [12-33] 33 (01/23 0600) BP: (85-126)/(51-82) 100/54 (01/23 0400) SpO2:  [88 %-100 %] 100 % (01/23 0600) Weight:  [118.9 kg] 118.9 kg (01/23 0500)  Hemodynamic parameters for last 24 hours: PAP: (58-61)/(15-27) 60/22 CO:  [8.1 L/min] 8.1 L/min CI:  [3.4 L/min/m2] 3.4 L/min/m2  Intake/Output from previous day: 01/22 0701 - 01/23 0700 In: 1819.3 [P.O.:840; I.V.:579.3; IV Piggyback:400] Out: 2835 [Urine:2675; Chest Tube:160] Intake/Output this shift: No intake/output data recorded.  General appearance: alert and cooperative Neurologic: intact Heart: regular rate and rhythm Lungs: diminished breath sounds bibasilar and wheezes bilaterally Abdomen: distended, nontender  Lab Results: Recent Labs    02/13/19 1740 02/13/19 1747 02/14/19 0457 02/14/19 0520  WBC 15.4*  --  14.4*  --   HGB 9.5*   < > 9.2* 8.8*  HCT 30.1*   < > 28.9* 26.0*  PLT 105*  --  104*  --    < > = values in this interval not displayed.   BMET:  Recent Labs    02/13/19 1740 02/13/19 1747 02/14/19 0457 02/14/19 0520  NA 132*   < > 135 135  K 4.2   < > 3.7 3.6  CL 99  --  97*  --   CO2 26  --  30  --   GLUCOSE 168*  --  120*  --   BUN 19  --  18  --   CREATININE 1.11  --  1.24  --   CALCIUM 7.4*  --  7.8*  --    < > = values in this interval not displayed.    PT/INR:  Recent Labs    02/12/19 0345  LABPROT 15.4*  INR 1.2    ABG    Component Value Date/Time   PHART 7.333 (L) 02/14/2019 0520   HCO3 29.0 (H) 02/14/2019 0520   TCO2 31 02/14/2019 0520   ACIDBASEDEF 4.0 (H) 02/12/2019 1549   O2SAT 95.0 02/14/2019 0520   CBG (last 3)  Recent Labs    02/13/19 1602 02/13/19 2219 02/14/19 0643  GLUCAP 107* 160* 132*    Assessment/Plan: S/P Procedure(s) (LRB): EMERGENCY REDO STERNOTOMY (N/A) EMERGENCY REDO CORONARY ARTERY BYPASS GRAFTING (CABG) USING RIGHT GREATER SAPHENOUS VEIN HARVESTED ENDOSCOPICALLY (N/A) EMERGENCY REPAIR OF AORTIC PSEUDOANEURYSM (N/A) TRANSESOPHAGEAL ECHOCARDIOGRAM (TEE) (N/A) -POD # 3 CV- in SR, coox 71 on milrinone  Continue milrinone to assist with diuresis RESP_ still primary issue with high FiO2 demand  Acute on chronic hypercarbic resp failure- pCO2 stable, elevated in 50s  Wean O2 as sat allows  Continue diuresis, xopenex and add pulmicort RENAL- continue diuresis with IV lasix  Supplement K ENDO- CBG reasonably well controlled  Continue levemir/ SSI DVT prophylaxis- SCD + enoxaparin Mobilize as tolerated   LOS: 3 days    Loreli Slot 02/14/2019

## 2019-02-14 NOTE — Progress Notes (Signed)
      [address]       [place] [address]             [phone]      POD # 3  Went into atrial fib with RVR this afternoon  No won amiodarone drip in and out of a fib, but with controlled rate  Co-ox 59.6  Continue IV amiodarone load  Viviann Spare C. Dorris Fetch, MD [facility] [phone]

## 2019-02-14 NOTE — Progress Notes (Addendum)
Patient into Afib with rates in the 130s. Diaphoretic. B/P stable. Paged Dorris Fetch, MD. Will give Amio bolus and start drip.

## 2019-02-15 ENCOUNTER — Inpatient Hospital Stay (HOSPITAL_COMMUNITY): Payer: 59

## 2019-02-15 LAB — TYPE AND SCREEN
ABO/RH(D): A POS
Antibody Screen: POSITIVE
Donor AG Type: NEGATIVE
Donor AG Type: NEGATIVE
Donor AG Type: NEGATIVE
Donor AG Type: NEGATIVE
Donor AG Type: NEGATIVE
Donor AG Type: NEGATIVE
Donor AG Type: NEGATIVE
Donor AG Type: NEGATIVE
PT AG Type: NEGATIVE
Unit division: 0
Unit division: 0
Unit division: 0
Unit division: 0
Unit division: 0
Unit division: 0
Unit division: 0
Unit division: 0
Unit division: 0
Unit division: 0
Unit division: 0
Unit division: 0

## 2019-02-15 LAB — BPAM RBC
Blood Product Expiration Date: 202101302359
Blood Product Expiration Date: 202102062359
Blood Product Expiration Date: 202102082359
Blood Product Expiration Date: 202102092359
Blood Product Expiration Date: 202102102359
Blood Product Expiration Date: 202102102359
Blood Product Expiration Date: 202102102359
Blood Product Expiration Date: 202102102359
Blood Product Expiration Date: 202102102359
Blood Product Expiration Date: 202102112359
Blood Product Expiration Date: 202102112359
Blood Product Expiration Date: 202102122359
ISSUE DATE / TIME: 202101202019
ISSUE DATE / TIME: 202101202019
ISSUE DATE / TIME: 202101202019
ISSUE DATE / TIME: 202101202019
Unit Type and Rh: 6200
Unit Type and Rh: 6200
Unit Type and Rh: 6200
Unit Type and Rh: 6200
Unit Type and Rh: 6200
Unit Type and Rh: 6200
Unit Type and Rh: 6200
Unit Type and Rh: 6200
Unit Type and Rh: 6200
Unit Type and Rh: 6200
Unit Type and Rh: 6200
Unit Type and Rh: 6200

## 2019-02-15 LAB — COMPREHENSIVE METABOLIC PANEL
ALT: 39 U/L (ref 0–44)
AST: 114 U/L — ABNORMAL HIGH (ref 15–41)
Albumin: 3 g/dL — ABNORMAL LOW (ref 3.5–5.0)
Alkaline Phosphatase: 55 U/L (ref 38–126)
Anion gap: 10 (ref 5–15)
BUN: 18 mg/dL (ref 8–23)
CO2: 29 mmol/L (ref 22–32)
Calcium: 7.6 mg/dL — ABNORMAL LOW (ref 8.9–10.3)
Chloride: 99 mmol/L (ref 98–111)
Creatinine, Ser: 0.99 mg/dL (ref 0.61–1.24)
GFR calc Af Amer: >60 mL/min (ref >60)
GFR calc non Af Amer: >60 mL/min (ref >60)
Glucose, Bld: 110 mg/dL — ABNORMAL HIGH (ref 70–99)
Potassium: 3.4 mmol/L — ABNORMAL LOW (ref 3.5–5.1)
Sodium: 138 mmol/L (ref 135–145)
Total Bilirubin: 0.6 mg/dL (ref 0.3–1.2)
Total Protein: 5.5 g/dL — ABNORMAL LOW (ref 6.5–8.1)

## 2019-02-15 LAB — CBC
HCT: 27.8 % — ABNORMAL LOW (ref 39.0–52.0)
Hemoglobin: 8.8 g/dL — ABNORMAL LOW (ref 13.0–17.0)
MCH: 31.1 pg (ref 26.0–34.0)
MCHC: 31.7 g/dL (ref 30.0–36.0)
MCV: 98.2 fL (ref 80.0–100.0)
Platelets: 119 10*3/uL — ABNORMAL LOW (ref 150–400)
RBC: 2.83 MIL/uL — ABNORMAL LOW (ref 4.22–5.81)
RDW: 13.2 % (ref 11.5–15.5)
WBC: 12.9 10*3/uL — ABNORMAL HIGH (ref 4.0–10.5)
nRBC: 0 % (ref 0.0–0.2)

## 2019-02-15 LAB — GLUCOSE, CAPILLARY
Glucose-Capillary: 155 mg/dL — ABNORMAL HIGH (ref 70–99)
Glucose-Capillary: 125 mg/dL — ABNORMAL HIGH (ref 70–99)
Glucose-Capillary: 113 mg/dL — ABNORMAL HIGH (ref 70–99)
Glucose-Capillary: 117 mg/dL — ABNORMAL HIGH (ref 70–99)
Glucose-Capillary: 117 mg/dL — ABNORMAL HIGH (ref 70–99)

## 2019-02-15 LAB — COOXEMETRY PANEL
Carboxyhemoglobin: 1.6 % — ABNORMAL HIGH (ref 0.5–1.5)
Carboxyhemoglobin: 1.4 % (ref 0.5–1.5)
Carboxyhemoglobin: 1.5 % (ref 0.5–1.5)
Methemoglobin: 1.1 % (ref 0.0–1.5)
Methemoglobin: 0.9 % (ref 0.0–1.5)
Methemoglobin: 1.1 % (ref 0.0–1.5)
O2 Saturation: 81.9 %
O2 Saturation: 76.9 %
O2 Saturation: 71.6 %
Total hemoglobin: 9 g/dL — ABNORMAL LOW (ref 12.0–16.0)
Total hemoglobin: 12.8 g/dL (ref 12.0–16.0)
Total hemoglobin: 9.4 g/dL — ABNORMAL LOW (ref 12.0–16.0)

## 2019-02-15 MED ORDER — POTASSIUM CHLORIDE CRYS ER 20 MEQ PO TBCR
20.0000 meq | EXTENDED_RELEASE_TABLET | ORAL | Status: AC
  Administered 2019-02-15: 20 meq via ORAL
  Filled 2019-02-15 (×2): qty 1

## 2019-02-15 MED ORDER — POTASSIUM CHLORIDE 10 MEQ/50ML IV SOLN
10.0000 meq | INTRAVENOUS | Status: AC
  Administered 2019-02-15 (×2): 10 meq via INTRAVENOUS
  Filled 2019-02-15 (×2): qty 50

## 2019-02-15 NOTE — Progress Notes (Signed)
      [address]       [place] [address]             [phone]      Sleeping BP 123/78 (BP Location: Left Arm)   Pulse 78   Temp 97.9 F (36.6 C) (Oral)   Resp 17   Ht 6' 0.01" (1.829 m)   Wt 122.8 kg   SpO2 93%   BMI 36.71 kg/m  I/O even so far today On 9L HFNC'   Dim Meisinger C. Dorris Fetch, MD [facility] [phone]

## 2019-02-15 NOTE — Progress Notes (Signed)
4 Days Post-Op Procedure(s) (LRB): EMERGENCY REDO STERNOTOMY (N/A) EMERGENCY REDO CORONARY ARTERY BYPASS GRAFTING (CABG) USING RIGHT GREATER SAPHENOUS VEIN HARVESTED ENDOSCOPICALLY (N/A) EMERGENCY REPAIR OF AORTIC PSEUDOANEURYSM (N/A) TRANSESOPHAGEAL ECHOCARDIOGRAM (TEE) (N/A) Subjective: Denies pain, nausea  Objective: Vital signs in last 24 hours: Temp:  [98.4 F (36.9 C)-99.4 F (37.4 C)] 99 F (37.2 C) (01/24 0748) Pulse Rate:  [68-120] 87 (01/24 0813) Cardiac Rhythm: Normal sinus rhythm (01/23 2000) Resp:  [11-32] 32 (01/24 0813) BP: (98-161)/(58-97) 152/76 (01/24 0813) SpO2:  [90 %-100 %] 96 % (01/24 0813) Weight:  [122.8 kg] 122.8 kg (01/24 0600)  Hemodynamic parameters for last 24 hours:    Intake/Output from previous day: 01/23 0701 - 01/24 0700 In: 1729.3 [P.O.:240; I.V.:1174.8; IV Piggyback:314.6] Out: 2825 [Urine:2825] Intake/Output this shift: No intake/output data recorded.  General appearance: alert, cooperative and no distress Neurologic: intact Heart: irregularly irregular rhythm Lungs: diminished breath sounds bibasilar Abdomen: distended, nontender  Lab Results: Recent Labs    02/14/19 0457 02/14/19 0457 02/14/19 0520 02/15/19 0452  WBC 14.4*  --   --  12.9*  HGB 9.2*   < > 8.8* 8.8*  HCT 28.9*   < > 26.0* 27.8*  PLT 104*  --   --  119*   < > = values in this interval not displayed.   BMET:  Recent Labs    02/14/19 0457 02/14/19 0457 02/14/19 0520 02/15/19 0452  NA 135   < > 135 138  K 3.7   < > 3.6 3.4*  CL 97*  --   --  99  CO2 30  --   --  29  GLUCOSE 120*  --   --  110*  BUN 18  --   --  18  CREATININE 1.24  --   --  0.99  CALCIUM 7.8*  --   --  7.6*   < > = values in this interval not displayed.    PT/INR: No results for input(s): LABPROT, INR in the last 72 hours. ABG    Component Value Date/Time   PHART 7.333 (L) 02/14/2019 0520   HCO3 29.0 (H) 02/14/2019 0520   TCO2 31 02/14/2019 0520   ACIDBASEDEF 4.0 (H) 02/12/2019  1549   O2SAT 76.9 02/15/2019 0517   CBG (last 3)  Recent Labs    02/14/19 1523 02/14/19 2138 02/15/19 0712  GLUCAP 114* 125* 155*    Assessment/Plan: S/P Procedure(s) (LRB): EMERGENCY REDO STERNOTOMY (N/A) EMERGENCY REDO CORONARY ARTERY BYPASS GRAFTING (CABG) USING RIGHT GREATER SAPHENOUS VEIN HARVESTED ENDOSCOPICALLY (N/A) EMERGENCY REPAIR OF AORTIC PSEUDOANEURYSM (N/A) TRANSESOPHAGEAL ECHOCARDIOGRAM (TEE) (N/A) -CV- in and out of atrial fib  With controlled VR  On IV amiodarone and PO Coreg- continue for now  Co-ox is 77 on milrinone  RESP- still requiring relatively high FiO2 despite moderate diuresis over past 48 hours  Atelectasis- Continue nebs, IS, diuresis  RENAL- creatinine normal, continue IV lasix, correct hypokalemia  ENDO- CBG well controlled  GI- abdomen appears distended, but is nontender and he says it is no different than his baseline  SCD + enoxaparin for DVT prophylaxis,    LOS: 4 days    Loreli Slot 02/15/2019

## 2019-02-16 ENCOUNTER — Inpatient Hospital Stay: Payer: Self-pay

## 2019-02-16 ENCOUNTER — Inpatient Hospital Stay (HOSPITAL_COMMUNITY): Payer: 59

## 2019-02-16 LAB — COOXEMETRY PANEL
Carboxyhemoglobin: 1.5 % (ref 0.5–1.5)
Methemoglobin: 1 % (ref 0.0–1.5)
O2 Saturation: 63.5 %
Total hemoglobin: 12.8 g/dL (ref 12.0–16.0)

## 2019-02-16 LAB — BASIC METABOLIC PANEL
Anion gap: 11 (ref 5–15)
BUN: 15 mg/dL (ref 8–23)
CO2: 34 mmol/L — ABNORMAL HIGH (ref 22–32)
Calcium: 8.3 mg/dL — ABNORMAL LOW (ref 8.9–10.3)
Chloride: 93 mmol/L — ABNORMAL LOW (ref 98–111)
Creatinine, Ser: 1.01 mg/dL (ref 0.61–1.24)
GFR calc Af Amer: >60 mL/min (ref >60)
GFR calc non Af Amer: >60 mL/min (ref >60)
Glucose, Bld: 146 mg/dL — ABNORMAL HIGH (ref 70–99)
Potassium: 3.3 mmol/L — ABNORMAL LOW (ref 3.5–5.1)
Sodium: 138 mmol/L (ref 135–145)

## 2019-02-16 LAB — COMPREHENSIVE METABOLIC PANEL
ALT: 39 U/L (ref 0–44)
AST: 68 U/L — ABNORMAL HIGH (ref 15–41)
Albumin: 3 g/dL — ABNORMAL LOW (ref 3.5–5.0)
Alkaline Phosphatase: 73 U/L (ref 38–126)
Anion gap: 13 (ref 5–15)
BUN: 13 mg/dL (ref 8–23)
CO2: 34 mmol/L — ABNORMAL HIGH (ref 22–32)
Calcium: 7.7 mg/dL — ABNORMAL LOW (ref 8.9–10.3)
Chloride: 87 mmol/L — ABNORMAL LOW (ref 98–111)
Creatinine, Ser: 0.97 mg/dL (ref 0.61–1.24)
GFR calc Af Amer: >60 mL/min (ref >60)
GFR calc non Af Amer: >60 mL/min (ref >60)
Glucose, Bld: 117 mg/dL — ABNORMAL HIGH (ref 70–99)
Potassium: 2.8 mmol/L — ABNORMAL LOW (ref 3.5–5.1)
Sodium: 134 mmol/L — ABNORMAL LOW (ref 135–145)
Total Bilirubin: 2.1 mg/dL — ABNORMAL HIGH (ref 0.3–1.2)
Total Protein: 5.9 g/dL — ABNORMAL LOW (ref 6.5–8.1)

## 2019-02-16 LAB — URINALYSIS, COMPLETE (UACMP) WITH MICROSCOPIC
Bilirubin Urine: NEGATIVE
Glucose, UA: NEGATIVE mg/dL
Ketones, ur: NEGATIVE mg/dL
Leukocytes,Ua: NEGATIVE
Nitrite: NEGATIVE
Protein, ur: NEGATIVE mg/dL
Specific Gravity, Urine: 1.008 (ref 1.005–1.030)
pH: 5 (ref 5.0–8.0)

## 2019-02-16 LAB — CBC
HCT: 19.9 % — ABNORMAL LOW (ref 39.0–52.0)
Hemoglobin: 6.4 g/dL — CL (ref 13.0–17.0)
MCH: 31.5 pg (ref 26.0–34.0)
MCHC: 32.2 g/dL (ref 30.0–36.0)
MCV: 98 fL (ref 80.0–100.0)
Platelets: 102 10*3/uL — ABNORMAL LOW (ref 150–400)
RBC: 2.03 MIL/uL — ABNORMAL LOW (ref 4.22–5.81)
RDW: 13 % (ref 11.5–15.5)
WBC: 8.6 10*3/uL (ref 4.0–10.5)
nRBC: 0.4 % — ABNORMAL HIGH (ref 0.0–0.2)

## 2019-02-16 LAB — HEMOGLOBIN AND HEMATOCRIT, BLOOD
HCT: 27.7 % — ABNORMAL LOW (ref 39.0–52.0)
Hemoglobin: 8.8 g/dL — ABNORMAL LOW (ref 13.0–17.0)

## 2019-02-16 LAB — GLUCOSE, CAPILLARY
Glucose-Capillary: 102 mg/dL — ABNORMAL HIGH (ref 70–99)
Glucose-Capillary: 123 mg/dL — ABNORMAL HIGH (ref 70–99)
Glucose-Capillary: 84 mg/dL (ref 70–99)
Glucose-Capillary: 101 mg/dL — ABNORMAL HIGH (ref 70–99)

## 2019-02-16 MED ORDER — POTASSIUM CHLORIDE 10 MEQ/50ML IV SOLN
10.0000 meq | INTRAVENOUS | Status: AC
  Administered 2019-02-16 (×3): 10 meq via INTRAVENOUS
  Filled 2019-02-16 (×4): qty 50

## 2019-02-16 MED ORDER — VANCOMYCIN HCL IN DEXTROSE 1-5 GM/200ML-% IV SOLN
1000.0000 mg | Freq: Two times a day (BID) | INTRAVENOUS | Status: DC
  Administered 2019-02-17 – 2019-02-19 (×5): 1000 mg via INTRAVENOUS
  Filled 2019-02-16 (×6): qty 200

## 2019-02-16 MED ORDER — VANCOMYCIN HCL IN DEXTROSE 1-5 GM/200ML-% IV SOLN: 1000.0000 mg | Freq: Two times a day (BID) | INTRAVENOUS | Status: DC

## 2019-02-16 MED ORDER — VANCOMYCIN HCL 10 G IV SOLR
2500.0000 mg | Freq: Once | INTRAVENOUS | Status: AC
  Administered 2019-02-16: 2500 mg via INTRAVENOUS
  Filled 2019-02-16: qty 2500

## 2019-02-16 MED ORDER — POTASSIUM CHLORIDE 10 MEQ/50ML IV SOLN
10.0000 meq | INTRAVENOUS | Status: DC
  Administered 2019-02-16: 10 meq via INTRAVENOUS
  Filled 2019-02-16 (×3): qty 50

## 2019-02-16 MED ORDER — AMIODARONE IV BOLUS ONLY 150 MG/100ML
150.0000 mg | Freq: Once | INTRAVENOUS | Status: AC
  Administered 2019-02-16: 150 mg via INTRAVENOUS

## 2019-02-16 MED ORDER — VANCOMYCIN HCL 1250 MG/250ML IV SOLN: 1250.0000 mg | Freq: Two times a day (BID) | INTRAVENOUS | Status: DC

## 2019-02-16 MED ORDER — GUAIFENESIN ER 600 MG PO TB12
600.0000 mg | ORAL_TABLET | Freq: Two times a day (BID) | ORAL | Status: DC
  Administered 2019-02-16 – 2019-02-19 (×7): 600 mg via ORAL
  Filled 2019-02-16 (×7): qty 1

## 2019-02-16 MED ORDER — POTASSIUM CHLORIDE CRYS ER 20 MEQ PO TBCR
20.0000 meq | EXTENDED_RELEASE_TABLET | ORAL | Status: AC
  Administered 2019-02-16 – 2019-02-17 (×3): 20 meq via ORAL
  Filled 2019-02-16: qty 1

## 2019-02-16 MED ORDER — FUROSEMIDE 10 MG/ML IJ SOLN
40.0000 mg | Freq: Two times a day (BID) | INTRAMUSCULAR | Status: DC
  Administered 2019-02-16 – 2019-02-17 (×3): 40 mg via INTRAVENOUS
  Filled 2019-02-16 (×3): qty 4

## 2019-02-16 MED ORDER — AMIODARONE HCL 200 MG PO TABS
400.0000 mg | ORAL_TABLET | Freq: Two times a day (BID) | ORAL | Status: DC
  Administered 2019-02-16 – 2019-02-17 (×3): 400 mg via ORAL
  Filled 2019-02-16 (×3): qty 2

## 2019-02-16 MED ORDER — POTASSIUM CHLORIDE CRYS ER 20 MEQ PO TBCR
40.0000 meq | EXTENDED_RELEASE_TABLET | Freq: Two times a day (BID) | ORAL | Status: DC
  Administered 2019-02-16 – 2019-02-19 (×7): 40 meq via ORAL
  Filled 2019-02-16 (×9): qty 2

## 2019-02-16 NOTE — Progress Notes (Signed)
CRITICAL VALUE ALERT  Critical Value:  Hemoglobin 6.4   Date & Time Notied:  02/16/19 0530  Provider Notified: None per MD order   Orders Received/Actions taken: None  Malva Limes RN

## 2019-02-16 NOTE — Progress Notes (Signed)
Patient ID: Evan Preston, male   DOB: 09/30/1955, 64 y.o.   MRN: [id number] EVENING ROUNDS NOTE :     [address]       [place] [address]             [phone]                 5 Days Post-Op Procedure(s) (LRB): EMERGENCY REDO STERNOTOMY (N/A) EMERGENCY REDO CORONARY ARTERY BYPASS GRAFTING (CABG) USING RIGHT GREATER SAPHENOUS VEIN HARVESTED ENDOSCOPICALLY (N/A) EMERGENCY REPAIR OF AORTIC PSEUDOANEURYSM (N/A) TRANSESOPHAGEAL ECHOCARDIOGRAM (TEE) (N/A)  Total Length of Stay:  LOS: 5 days  BP 127/85   Pulse 70   Temp 98.4 F (36.9 C) (Oral)   Resp 17   Ht 6' 0.01" (1.829 m)   Wt 117.1 kg   SpO2 97%   BMI 35.00 kg/m   .Intake/Output      01/24 0701 - 01/25 0700 01/25 0701 - 01/26 0700   P.O. 720 240   I.V. (mL/kg) 1052.6 (9) 833.9 (7.1)   IV Piggyback 60.9 675.1   Total Intake(mL/kg) 1833.5 (15.7) 1749.1 (14.9)   Urine (mL/kg/hr) 2425 (0.9) 1550 (1.2)   Stool     Total Output 2425 1550   Net -591.5 +199.1        Urine Occurrence  1 x     . sodium chloride Stopped (02/13/19 0855)  . sodium chloride    . sodium chloride 20 mL/hr at 02/13/19 2000  . sodium chloride Stopped (02/16/19 0902)  . milrinone 0.125 mcg/kg/min (02/16/19 1800)  . [START ON 02/17/2019] vancomycin       Lab Results  Component Value Date   WBC 8.6 02/16/2019   HGB 8.8 (L) 02/16/2019   HCT 27.7 (L) 02/16/2019   PLT 102 (L) 02/16/2019   GLUCOSE 146 (H) 02/16/2019   ALT 39 02/16/2019   AST 68 (H) 02/16/2019   NA 138 02/16/2019   K 3.3 (L) 02/16/2019   CL 93 (L) 02/16/2019   CREATININE 1.01 02/16/2019   BUN 15 02/16/2019   CO2 34 (H) 02/16/2019   INR 1.2 02/12/2019   Walked around unit today Still on low dose milrinone    Delight Ovens MD  Beeper [phone] Office [phone] 02/16/2019 6:04 PM

## 2019-02-16 NOTE — Progress Notes (Signed)
Pharmacy Antibiotic Note  Evan Preston is a 64 y.o. male admitted on 02/11/2019 with chest pain in need of redo CABG.  Pharmacy has been consulted for vancomycin dosing for possible sternal wound cellulitis.  Tmax 100.7, wbc 8, renal function is normal. Will order loading dose for this morning and scheduled dosing to start in am.   Vancomycin 1000 mg IV Q 12 hrs. Goal AUC 400-550. Expected AUC: 453 SCr used: 0.97  Will plan on checking levels as indicated  Height: 6' 0.01" (182.9 cm) Weight: 258 lb 2.5 oz (117.1 kg) IBW/kg (Calculated) : 77.62  Temp (24hrs), Avg:98.6 F (37 C), Min:97.9 F (36.6 C), Max:98.9 F (37.2 C)  Recent Labs  Lab 02/13/19 0352 02/13/19 1740 02/14/19 0457 02/15/19 0452 02/16/19 0455 02/16/19 0552  WBC 14.5* 15.4* 14.4* 12.9* 8.6  --   CREATININE 1.18 1.11 1.24 0.99  --  0.97    Estimated Creatinine Clearance: 103 mL/min (by C-G formula based on SCr of 0.97 mg/dL).    Allergies  Allergen Reactions  . Penicillins Anaphylaxis  . Egg [Eggs Or Egg-Derived Products] Swelling    Egg Salad  . Milk-Related Compounds Rash    Sour Cream    Thank you for allowing pharmacy to be a part of this patient's care.  Sheppard Coil PharmD., BCPS Clinical Pharmacist 02/16/2019 12:01 PM

## 2019-02-16 NOTE — Progress Notes (Addendum)
[address]       [place] [address]             [phone]      5 Days Post-Op Procedure(s) (LRB): EMERGENCY REDO STERNOTOMY (N/A) EMERGENCY REDO CORONARY ARTERY BYPASS GRAFTING (CABG) USING RIGHT GREATER SAPHENOUS VEIN HARVESTED ENDOSCOPICALLY (N/A) EMERGENCY REPAIR OF AORTIC PSEUDOANEURYSM (N/A) TRANSESOPHAGEAL ECHOCARDIOGRAM (TEE) (N/A) Subjective: Feels pretty well, slowly getting stronger  Objective: Vital signs in last 24 hours: Temp:  [97.9 F (36.6 C)-100.7 F (38.2 C)] 98.7 F (37.1 C) (01/25 0655) Pulse Rate:  [71-93] 82 (01/25 0700) Cardiac Rhythm: Normal sinus rhythm (01/24 2000) Resp:  [13-33] 21 (01/25 0700) BP: (118-170)/(64-129) 164/92 (01/25 0600) SpO2:  [93 %-99 %] 96 % (01/25 0700) Weight:  [117.1 kg] 117.1 kg (01/25 0600)  Hemodynamic parameters for last 24 hours:    Intake/Output from previous day: 01/24 0701 - 01/25 0700 In: 1833.5 [P.O.:720; I.V.:1052.6; IV Piggyback:60.9] Out: 2425 [Urine:2425] Intake/Output this shift: No intake/output data recorded.  General appearance: alert, cooperative and no distress Heart: irregularly irregular rhythm and tachy Lungs: fair air exchange, scattered wheeze Abdomen: mild distension, + BS, nontender Extremities: + edema Wound: incis healing well  Lab Results: Recent Labs    02/15/19 0452 02/15/19 0452 02/16/19 0455 02/16/19 0552  WBC 12.9*  --  8.6  --   HGB 8.8*   < > 6.4* 8.8*  HCT 27.8*   < > 19.9* 27.7*  PLT 119*  --  102*  --    < > = values in this interval not displayed.   BMET:  Recent Labs    02/15/19 0452 02/16/19 0552  NA 138 134*  K 3.4* 2.8*  CL 99 87*  CO2 29 34*  GLUCOSE 110* 117*  BUN 18 13  CREATININE 0.99 0.97  CALCIUM 7.6* 7.7*    PT/INR: No results for input(s): LABPROT, INR in the last 72 hours. ABG    Component Value Date/Time   PHART 7.333 (L) 02/14/2019 0520   HCO3 29.0 (H) 02/14/2019 0520   TCO2 31 02/14/2019 0520   ACIDBASEDEF  4.0 (H) 02/12/2019 1549   O2SAT 63.5 02/16/2019 0433   CBG (last 3)  Recent Labs    02/15/19 1610 02/15/19 2135 02/15/19 2228  GLUCAP 113* 117* 117*    Meds Scheduled Meds: . aspirin EC  325 mg Oral Daily   Or  . aspirin  324 mg Per Tube Daily  . bisacodyl  10 mg Oral Daily   Or  . bisacodyl  10 mg Rectal Daily  . budesonide  0.5 mg Nebulization BID  . carvedilol  12.5 mg Oral BID WC  . Chlorhexidine Gluconate Cloth  6 each Topical Daily  . docusate sodium  200 mg Oral Daily  . enoxaparin (LOVENOX) injection  40 mg Subcutaneous Q24H  . gabapentin  300 mg Oral QHS  . insulin aspart  0-15 Units Subcutaneous TID WC  . insulin aspart  0-5 Units Subcutaneous QHS  . insulin detemir  15 Units Subcutaneous BID  . levalbuterol  1.25 mg Nebulization Q6H  . metoCLOPramide (REGLAN) injection  10 mg Intravenous Q6H  . oxybutynin  5 mg Oral Daily  . pantoprazole  40 mg Oral Daily  . sodium chloride flush  10-40 mL Intracatheter Q12H  . sodium chloride flush  3 mL Intravenous Q12H   Continuous Infusions: . sodium chloride Stopped (02/13/19 0855)  . sodium chloride    . sodium chloride 20 mL/hr  at 02/13/19 2000  . sodium chloride 10 mL/hr at 02/16/19 0600  . amiodarone 30 mg/hr (02/16/19 0600)  . furosemide (LASIX) infusion 8 mg/hr (02/16/19 0600)  . lactated ringers    . lactated ringers Stopped (02/13/19 0300)  . milrinone 0.3 mcg/kg/min (02/16/19 0600)  . nitroGLYCERIN Stopped (02/12/19 0437)  . norepinephrine (LEVOPHED) Adult infusion Stopped (02/12/19 2317)  . potassium chloride     PRN Meds:.sodium chloride, sodium chloride, fentaNYL (SUBLIMAZE) injection, ondansetron (ZOFRAN) IV, oxyCODONE, sodium chloride flush, sodium chloride flush, sodium chloride flush, traMADol  Xrays DG Chest Port 1 View  Result Date: 02/16/2019 CLINICAL DATA:  Atelectasis EXAM: PORTABLE CHEST 1 VIEW COMPARISON:  Radiograph 02/15/2019 FINDINGS: Right IJ approach catheter sheath terminates in the  upper SVC, slightly retracted from comparison study. Postsurgical changes related to prior CABG including intact and aligned sternotomy wires and multiple surgical clips projecting over the mediastinum. Surgical staples project over the right shoulder with clips in the axillary region. Telemetry leads overlie the chest. There is some increased prominence of the aortic arch when compared to prior radiographs though this could be in part due to patient rotation with a left anterior obliquity. Remaining cardiomediastinal contours are unchanged from prior with stable cardiomegaly. Some bandlike areas of opacities in both lungs are compatible with subsegmental atelectasis. No new consolidative process, pneumothorax or effusion. No acute osseous or soft tissue abnormality. IMPRESSION: 1. Increased prominence of the aortic arch when compared to prior radiographs though this could be in part due to patient rotation with a left anterior obliquity. 2. Stable cardiomegaly. 3. Persistent bandlike areas of subsegmental atelectasis. Electronically Signed   By: Kreg Shropshire M.D.   On: 02/16/2019 05:11   DG Chest Port 1 View  Result Date: 02/15/2019 CLINICAL DATA:  Postop CABG EXAM: PORTABLE CHEST 1 VIEW COMPARISON:  02/14/2019 FINDINGS: Right jugular sheath remains in place. Cardiac enlargement without edema. No pneumothorax. Mild bibasilar atelectasis unchanged.  Small right effusion. IMPRESSION: Mild bibasilar atelectasis and small right effusion. Negative for pneumothorax or edema. Electronically Signed   By: Marlan Palau M.D.   On: 02/15/2019 07:24    Assessment/Plan: S/P Procedure(s) (LRB): EMERGENCY REDO STERNOTOMY (N/A) EMERGENCY REDO CORONARY ARTERY BYPASS GRAFTING (CABG) USING RIGHT GREATER SAPHENOUS VEIN HARVESTED ENDOSCOPICALLY (N/A) EMERGENCY REPAIR OF AORTIC PSEUDOANEURYSM (N/A) TRANSESOPHAGEAL ECHOCARDIOGRAM (TEE) (N/A)  1 remains stable with slow /steady progress 2 + HTN on milrinone- CoOX is 63 ,  wean as able 3 sats ok  On 10 liters HFNC, cont xopenex and pulmocort- add flutter valve 4 CXR - atx, aortic arch is more prominent, may be related to positioning 5 volume overload- reasonable UOP on lasix gtt, normal renal fxn/GFR, replace K+  6 ABL anemia - stable 7 afib on amio gtt, coreg, cont at current dosing for now 8 BS well controlled 9 Tmax 100.7, no leukocytosis. Monitor closely , check UA 10 thrombocytopenia, fairly stable 11 push rehab as able  LOS: 5 days    Rowe Clack PA-C 02/16/2019 Pager [phone]  DC old R IJ Start Mucinex Decrease milrinone to .125 Lasix BID Start PT Vancomycin for cellulitis around sternum Leave in ICU due to pulmonary status on Hi flow O2  patient examined and medical record reviewed,agree with above note. Kathlee Nations Trigt III 02/16/2019

## 2019-02-16 NOTE — Progress Notes (Signed)
Chaplain responded to page for pt request a Chaplain. 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 Evan Preston expressed that he is tired of living in pain and doing harm to his body via smoking. Chaplain had prayer with Evan Preston and encouraged him in his future endeavors to maintain a smoke free lifestyle. Chaplains remain available for support as needs arise. Chaplain will supply Evan Preston with a rosary.   Chaplain Resident, Amado Coe, M Div [phone] on-call pager

## 2019-02-17 ENCOUNTER — Inpatient Hospital Stay (HOSPITAL_COMMUNITY): Payer: 59

## 2019-02-17 LAB — CBC
HCT: 26.9 % — ABNORMAL LOW (ref 39.0–52.0)
Hemoglobin: 8.9 g/dL — ABNORMAL LOW (ref 13.0–17.0)
MCH: 31.6 pg (ref 26.0–34.0)
MCHC: 33.1 g/dL (ref 30.0–36.0)
MCV: 95.4 fL (ref 80.0–100.0)
Platelets: 160 10*3/uL (ref 150–400)
RBC: 2.82 MIL/uL — ABNORMAL LOW (ref 4.22–5.81)
RDW: 12.9 % (ref 11.5–15.5)
WBC: 11.6 10*3/uL — ABNORMAL HIGH (ref 4.0–10.5)
nRBC: 0 % (ref 0.0–0.2)

## 2019-02-17 LAB — COMPREHENSIVE METABOLIC PANEL
ALT: 42 U/L (ref 0–44)
AST: 54 U/L — ABNORMAL HIGH (ref 15–41)
Albumin: 2.8 g/dL — ABNORMAL LOW (ref 3.5–5.0)
Alkaline Phosphatase: 81 U/L (ref 38–126)
Anion gap: 10 (ref 5–15)
BUN: 15 mg/dL (ref 8–23)
CO2: 36 mmol/L — ABNORMAL HIGH (ref 22–32)
Calcium: 8.2 mg/dL — ABNORMAL LOW (ref 8.9–10.3)
Chloride: 94 mmol/L — ABNORMAL LOW (ref 98–111)
Creatinine, Ser: 1.05 mg/dL (ref 0.61–1.24)
GFR calc Af Amer: >60 mL/min (ref >60)
GFR calc non Af Amer: >60 mL/min (ref >60)
Glucose, Bld: 108 mg/dL — ABNORMAL HIGH (ref 70–99)
Potassium: 3.4 mmol/L — ABNORMAL LOW (ref 3.5–5.1)
Sodium: 140 mmol/L (ref 135–145)
Total Bilirubin: 1.9 mg/dL — ABNORMAL HIGH (ref 0.3–1.2)
Total Protein: 5.8 g/dL — ABNORMAL LOW (ref 6.5–8.1)

## 2019-02-17 LAB — GLUCOSE, CAPILLARY
Glucose-Capillary: 166 mg/dL — ABNORMAL HIGH (ref 70–99)
Glucose-Capillary: 99 mg/dL (ref 70–99)

## 2019-02-17 MED ORDER — POTASSIUM CHLORIDE CRYS ER 20 MEQ PO TBCR
20.0000 meq | EXTENDED_RELEASE_TABLET | ORAL | Status: AC
  Administered 2019-02-17 (×3): 20 meq via ORAL
  Filled 2019-02-17 (×3): qty 1

## 2019-02-17 MED ORDER — FUROSEMIDE 10 MG/ML IJ SOLN
40.0000 mg | Freq: Every day | INTRAMUSCULAR | Status: DC
  Administered 2019-02-18 – 2019-02-19 (×2): 40 mg via INTRAVENOUS
  Filled 2019-02-17 (×2): qty 4

## 2019-02-17 MED ORDER — SORBITOL 70 % PO SOLN: 30.0000 mL | Freq: Every morning | ORAL | Status: DC

## 2019-02-17 MED ORDER — INSULIN DETEMIR 100 UNIT/ML ~~LOC~~ SOLN
10.0000 [IU] | Freq: Two times a day (BID) | SUBCUTANEOUS | Status: DC
  Administered 2019-02-17: 10 [IU] via SUBCUTANEOUS
  Filled 2019-02-17 (×4): qty 0.1

## 2019-02-17 MED ORDER — SORBITOL 70 % SOLN
30.0000 mL | Freq: Every morning | Status: AC
  Administered 2019-02-17 – 2019-02-18 (×2): 30 mL via ORAL
  Filled 2019-02-17 (×2): qty 30

## 2019-02-17 MED ORDER — AMIODARONE HCL 200 MG PO TABS
200.0000 mg | ORAL_TABLET | Freq: Two times a day (BID) | ORAL | Status: DC
  Administered 2019-02-17 – 2019-02-19 (×4): 200 mg via ORAL
  Filled 2019-02-17 (×4): qty 1

## 2019-02-17 MED ORDER — LEVALBUTEROL HCL 1.25 MG/0.5ML IN NEBU
1.2500 mg | INHALATION_SOLUTION | Freq: Two times a day (BID) | RESPIRATORY_TRACT | Status: DC
  Administered 2019-02-18 – 2019-02-19 (×3): 1.25 mg via RESPIRATORY_TRACT
  Filled 2019-02-17 (×3): qty 0.5

## 2019-02-17 MED FILL — Heparin Sodium (Porcine) Inj 1000 Unit/ML: INTRAMUSCULAR | Qty: 20 | Status: AC

## 2019-02-17 MED FILL — Sodium Bicarbonate IV Soln 8.4%: INTRAVENOUS | Qty: 100 | Status: AC

## 2019-02-17 MED FILL — Potassium Chloride Inj 2 mEq/ML: INTRAVENOUS | Qty: 40 | Status: AC

## 2019-02-17 MED FILL — Heparin Sodium (Porcine) Inj 1000 Unit/ML: INTRAMUSCULAR | Qty: 30 | Status: AC

## 2019-02-17 MED FILL — Electrolyte-R (PH 7.4) Solution: INTRAVENOUS | Qty: 4000 | Status: AC

## 2019-02-17 MED FILL — Mannitol IV Soln 20%: INTRAVENOUS | Qty: 500 | Status: AC

## 2019-02-17 MED FILL — Calcium Chloride Inj 10%: INTRAVENOUS | Qty: 10 | Status: AC

## 2019-02-17 MED FILL — Magnesium Sulfate Inj 50%: INTRAMUSCULAR | Qty: 10 | Status: AC

## 2019-02-17 MED FILL — Lidocaine HCl Local Soln Prefilled Syringe 100 MG/5ML (2%): INTRAMUSCULAR | Qty: 5 | Status: AC

## 2019-02-17 NOTE — Progress Notes (Signed)
PT Cancellation Note  Patient Details Name: Evan Preston MRN: [id number] DOB: 06-17-1955   Cancelled Treatment:    Reason Eval/Treat Not Completed: Patient declined, no reason specified Pt sleeping and reports he has already walked twice today and he was told that he gets a rest day in bed. Explained importance of being OOB daily and mobility. Will follow.   [name] A [name] 02/17/2019, 11:15 AM [name], PT, DPT Acute Rehabilitation Services Pager [phone] Office [phone]

## 2019-02-17 NOTE — Progress Notes (Signed)
Checked on pt earlier and he was asleep so let him rest. Returned and pt just had BM in bedpan. Anxious/SOB. Declined walking. Sts he needs rest. Encouraged another walk later and reminded him of the importance of being OOB and using [facility] with staff approval. I attempted review of sternal precautions but he stated he had already heard all that this am. Encouraged IS (got a new one). Will f/u tomorrow. [phone] Ethelda Chick CES, ACSM 2:22 PM 02/17/2019

## 2019-02-17 NOTE — Plan of Care (Signed)

## 2019-02-17 NOTE — Progress Notes (Signed)
6 Days Post-Op Procedure(s) (LRB): EMERGENCY REDO STERNOTOMY (N/A) EMERGENCY REDO CORONARY ARTERY BYPASS GRAFTING (CABG) USING RIGHT GREATER SAPHENOUS VEIN HARVESTED ENDOSCOPICALLY (N/A) EMERGENCY REPAIR OF AORTIC PSEUDOANEURYSM (N/A) TRANSESOPHAGEAL ECHOCARDIOGRAM (TEE) (N/A) Subjective: Pulmonary status, oxygenation much improved, hi flow O2 nt needed Walking in hallway, tolerating diet. Foley out Maintaining NSR opn po amio Milrinone now off Ready for transfer to progressive care- order in place Objective: Vital signs in last 24 hours: Temp:  [98 F (36.7 C)-98.7 F (37.1 C)] 98 F (36.7 C) (01/26 0741) Pulse Rate:  [67-76] 75 (01/26 0500) Cardiac Rhythm: Normal sinus rhythm (01/26 0800) Resp:  [10-27] 23 (01/26 0621) BP: (88-153)/(58-94) 153/85 (01/26 0621) SpO2:  [93 %-100 %] 96 % (01/26 0800) Weight:  [117.4 kg] 117.4 kg (01/26 0500)  Hemodynamic parameters for last 24 hours:   stable Intake/Output from previous day: 01/25 0701 - 01/26 0700 In: 2005 [P.O.:240; I.V.:889.6; IV Piggyback:875.4] Out: 2600 [Urine:2600] Intake/Output this shift: No intake/output data recorded.       Exam    General- alert and comfortable    Neck- no JVD, no cervical adenopathy palpable, no carotid bruit   Lungs- scattered rhonchi, wheezes   Cor- regular rate and rhythm, no murmur , gallop   Abdomen- soft, non-tender   Extremities - warm, non-tender, minimal edema   Neuro- oriented, appropriate,  focal weakness R hand from brachial plexopathy- PT will treat   Lab Results: Recent Labs    02/16/19 0455 02/16/19 0455 02/16/19 0552 02/17/19 0208  WBC 8.6  --   --  11.6*  HGB 6.4*   < > 8.8* 8.9*  HCT 19.9*   < > 27.7* 26.9*  PLT 102*  --   --  160   < > = values in this interval not displayed.   BMET:  Recent Labs    02/16/19 1607 02/17/19 0208  NA 138 140  K 3.3* 3.4*  CL 93* 94*  CO2 34* 36*  GLUCOSE 146* 108*  BUN 15 15  CREATININE 1.01 1.05  CALCIUM 8.3* 8.2*     PT/INR: No results for input(s): LABPROT, INR in the last 72 hours. ABG    Component Value Date/Time   PHART 7.333 (L) 02/14/2019 0520   HCO3 29.0 (H) 02/14/2019 0520   TCO2 31 02/14/2019 0520   ACIDBASEDEF 4.0 (H) 02/12/2019 1549   O2SAT 63.5 02/16/2019 0433   CBG (last 3)  Recent Labs    02/16/19 1514 02/16/19 2131 02/17/19 0641  GLUCAP 84 101* 166*    Assessment/Plan: S/P Procedure(s) (LRB): EMERGENCY REDO STERNOTOMY (N/A) EMERGENCY REDO CORONARY ARTERY BYPASS GRAFTING (CABG) USING RIGHT GREATER SAPHENOUS VEIN HARVESTED ENDOSCOPICALLY (N/A) EMERGENCY REPAIR OF AORTIC PSEUDOANEURYSM (N/A) TRANSESOPHAGEAL ECHOCARDIOGRAM (TEE) (N/A) tx to stepdown Cont po lasix, wean O2 as tolerated IV vancomycin for cellulitis of redo sternotomy Resume Noac at discharge for hx DVT and caval filter in place DC EPWs tomorrow  LOS: 6 days    Evan Preston 02/17/2019

## 2019-02-17 NOTE — Plan of Care (Signed)
  Problem: Education: Goal: Knowledge of disease or condition will improve Outcome: Progressing Goal: Knowledge of the prescribed therapeutic regimen will improve Outcome: Progressing   Problem: Activity: Goal: Risk for activity intolerance will decrease Outcome: Progressing   Problem: Cardiac: Goal: Will achieve and/or maintain hemodynamic stability Outcome: Progressing   Problem: Clinical Measurements: Goal: Postoperative complications will be avoided or minimized Outcome: Progressing   

## 2019-02-18 ENCOUNTER — Inpatient Hospital Stay (HOSPITAL_COMMUNITY): Payer: 59

## 2019-02-18 LAB — GLUCOSE, CAPILLARY
Glucose-Capillary: 116 mg/dL — ABNORMAL HIGH (ref 70–99)
Glucose-Capillary: 116 mg/dL — ABNORMAL HIGH (ref 70–99)
Glucose-Capillary: 92 mg/dL (ref 70–99)
Glucose-Capillary: 115 mg/dL — ABNORMAL HIGH (ref 70–99)
Glucose-Capillary: 123 mg/dL — ABNORMAL HIGH (ref 70–99)

## 2019-02-18 LAB — COMPREHENSIVE METABOLIC PANEL
ALT: 43 U/L (ref 0–44)
AST: 40 U/L (ref 15–41)
Albumin: 2.7 g/dL — ABNORMAL LOW (ref 3.5–5.0)
Alkaline Phosphatase: 84 U/L (ref 38–126)
Anion gap: 9 (ref 5–15)
BUN: 12 mg/dL (ref 8–23)
CO2: 33 mmol/L — ABNORMAL HIGH (ref 22–32)
Calcium: 8.3 mg/dL — ABNORMAL LOW (ref 8.9–10.3)
Chloride: 97 mmol/L — ABNORMAL LOW (ref 98–111)
Creatinine, Ser: 0.82 mg/dL (ref 0.61–1.24)
GFR calc Af Amer: >60 mL/min (ref >60)
GFR calc non Af Amer: >60 mL/min (ref >60)
Glucose, Bld: 113 mg/dL — ABNORMAL HIGH (ref 70–99)
Potassium: 3.7 mmol/L (ref 3.5–5.1)
Sodium: 139 mmol/L (ref 135–145)
Total Bilirubin: 1.4 mg/dL — ABNORMAL HIGH (ref 0.3–1.2)
Total Protein: 5.5 g/dL — ABNORMAL LOW (ref 6.5–8.1)

## 2019-02-18 LAB — CBC
HCT: 29 % — ABNORMAL LOW (ref 39.0–52.0)
Hemoglobin: 9.3 g/dL — ABNORMAL LOW (ref 13.0–17.0)
MCH: 30.8 pg (ref 26.0–34.0)
MCHC: 32.1 g/dL (ref 30.0–36.0)
MCV: 96 fL (ref 80.0–100.0)
Platelets: 202 10*3/uL (ref 150–400)
RBC: 3.02 MIL/uL — ABNORMAL LOW (ref 4.22–5.81)
RDW: 13.2 % (ref 11.5–15.5)
WBC: 11 10*3/uL — ABNORMAL HIGH (ref 4.0–10.5)
nRBC: 0 % (ref 0.0–0.2)

## 2019-02-18 MED ORDER — ASPIRIN EC 81 MG PO TBEC
81.0000 mg | DELAYED_RELEASE_TABLET | Freq: Every day | ORAL | Status: DC
  Administered 2019-02-18 – 2019-02-19 (×2): 81 mg via ORAL
  Filled 2019-02-18 (×2): qty 1

## 2019-02-18 MED ORDER — LOSARTAN POTASSIUM 25 MG PO TABS
25.0000 mg | ORAL_TABLET | Freq: Every day | ORAL | Status: DC
  Administered 2019-02-18 – 2019-02-19 (×2): 25 mg via ORAL
  Filled 2019-02-18 (×2): qty 1

## 2019-02-18 MED ORDER — ROSUVASTATIN CALCIUM 20 MG PO TABS
40.0000 mg | ORAL_TABLET | Freq: Every day | ORAL | Status: DC
  Administered 2019-02-18: 40 mg via ORAL
  Filled 2019-02-18: qty 2
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
## 2019-02-18 NOTE — Progress Notes (Signed)
CARDIAC REHAB PHASE I   PRE:  Rate/Rhythm: 82 SR    BP: sitting 118/73    SaO2: 92-93 3L  MODE:  Ambulation: 240 ft   POST:  Rate/Rhythm: 89 SR    BP: sitting 148/90     SaO2: 100 3L (96 3L walking)  Pt up in recliner. Walked with PT earlier. Able to stand independently with cues and walk with RW. Slow and steady pace. DOE and fatigue with distance. VSS. Return to recliner. Encouraged to walk x1 more this evening, IS, and sternal precautions. Also encouraged pt to have more PO intake. [id number]   Harriet Masson CES, ACSM 02/18/2019 2:01 PM

## 2019-02-18 NOTE — Evaluation (Signed)
Physical Therapy Evaluation Patient Details Name: Evan Preston MRN: [id number] DOB: 12-03-1955 Today's Date: 02/18/2019   History of Present Illness  Patient is a 64 year old male with a past medical history of hypertension, coronary artery disease status post CABG who presented to [facility] with severe onset of central chest pain. 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 Pt underwent emergency redo sternotomy with CABG using R greater sphenous veing, also repair of aortic pseudoanerysm on 1/21.    Clinical Impression  Pt admitted with above. Pt with noted impaired memory and recall of precautions. Pt mobilizing with minA, RW, and 3lo2 via [place]. Anticipate pt to progress well and achieve supervision level of function for safe d/c home with spouse. Acute PT to cont to follow.    Follow Up Recommendations Home health PT;Supervision/Assistance - 24 hour    Equipment Recommendations  Rolling walker with 5" wheels;3in1 (PT)    Recommendations for Other Services       Precautions / Restrictions Precautions Precautions: Sternal Precaution Booklet Issued: No Precaution Comments: pt with poor recall, pt re-education Restrictions Weight Bearing Restrictions: Yes      Mobility  Bed Mobility Overal bed mobility: Needs Assistance Bed Mobility: Rolling;Sidelying to Sit Rolling: Min assist Sidelying to sit: Min assist       General bed mobility comments: max directional verbal cues to adhere to sternal precautions, minA for trunk elevation  Transfers Overall transfer level: Needs assistance Equipment used: Rolling walker (2 wheeled) Transfers: Sit to/from Stand Sit to Stand: Min assist         General transfer comment: verbal cues to rock forward onto feet and push up from feet not push up with arm, pt adhered and completed with minimal assist  Ambulation/Gait Ambulation/Gait assistance: Min guard Gait Distance (Feet): 300 Feet Assistive device: Rolling  walker (2 wheeled) Gait Pattern/deviations: Step-through pattern;Decreased stride length Gait velocity: slow Gait velocity interpretation: <1.31 ft/sec, indicative of household ambulator General Gait Details: pt with 2 standing rest breaks, SpO2 >95% on 3LO2 via [place], pt unable to talk and walker due to SOB, pt reporting R LE pain however doesn't demo antalgic gait pattern  Stairs            Wheelchair Mobility    Modified Rankin (Stroke Patients Only)       Balance Overall balance assessment: Needs assistance Sitting-balance support: Feet supported;No upper extremity supported Sitting balance-Leahy Scale: Good     Standing balance support: Bilateral upper extremity supported Standing balance-Leahy Scale: Fair Standing balance comment: dependent on UE support at this time                             Pertinent Vitals/Pain Pain Assessment: 0-10 Pain Score: 7  Pain Location: chest incision, 9/10 in  R LE with walking Pain Descriptors / Indicators: Burning;Constant Pain Intervention(s): Monitored during session    Home Living Family/patient expects to be discharged to:: Private residence Living Arrangements: Spouse/significant other Available Help at Discharge: Family;Available PRN/intermittently Type of Home: House Home Access: Stairs to enter Entrance Stairs-Rails: Right Entrance Stairs-Number of Steps: 5 Home Layout: One level Home Equipment: Cane - single point;Shower seat      Prior Function Level of Independence: Independent         Comments: Drives, works.     Hand Dominance   Dominant Hand: Left    Extremity/Trunk Assessment   Upper Extremity Assessment Upper Extremity Assessment: (no MMT due to  sternal precautions, R hand with dec grip)    Lower Extremity Assessment Lower Extremity Assessment: Generalized weakness    Cervical / Trunk Assessment Cervical / Trunk Assessment: Other exceptions Cervical / Trunk Exceptions: sternal  incision  Communication   Communication: No difficulties  Cognition Arousal/Alertness: Awake/alert Behavior During Therapy: Flat affect Overall Cognitive Status: No family/caregiver present to determine baseline cognitive functioning                                 General Comments: pt with no memory of PT coming in earlier today to get OOB, pt with no recall of precautions, pt with flat affect and noted anxiety      General Comments General comments (skin integrity, edema, etc.): trialed pt on RA, pt's SpO2 dec to 86%    Exercises     Assessment/Plan    PT Assessment Patient needs continued PT services  PT Problem List Decreased strength;Decreased activity tolerance;Decreased balance;Decreased mobility;Decreased coordination;Decreased cognition;Decreased knowledge of use of DME       PT Treatment Interventions DME instruction;Gait training;Stair training;Functional mobility training;Therapeutic activities;Therapeutic exercise;Balance training;Neuromuscular re-education    PT Goals (Current goals can be found in the Care Plan section)  Acute Rehab PT Goals Patient Stated Goal: get better PT Goal Formulation: With patient Time For Goal Achievement: 03/04/19 Potential to Achieve Goals: Good    Frequency Min 3X/week   Barriers to discharge        Co-evaluation               AM-PAC PT "6 Clicks" Mobility  Outcome Measure Help needed turning from your back to your side while in a flat bed without using bedrails?: A Little Help needed moving from lying on your back to sitting on the side of a flat bed without using bedrails?: A Little Help needed moving to and from a bed to a chair (including a wheelchair)?: A Little Help needed standing up from a chair using your arms (e.g., wheelchair or bedside chair)?: A Little Help needed to walk in hospital room?: A Little Help needed climbing 3-5 steps with a railing? : A Lot 6 Click Score: 17    End of Session  Equipment Utilized During Treatment: Gait belt;Oxygen Activity Tolerance: Patient tolerated treatment well Patient left: in chair;with call bell/phone within reach Nurse Communication: Mobility status PT Visit Diagnosis: Unsteadiness on feet (R26.81);Muscle weakness (generalized) (M62.81);Difficulty in walking, not elsewhere classified (R26.2)    Time: 9622-2979 PT Time Calculation (min) (ACUTE ONLY): 25 min   Charges:   PT Evaluation $PT Eval Moderate Complexity: 1 Mod PT Treatments $Gait Training: 8-22 mins        Kittie Plater, PT, DPT Acute Rehabilitation Services Pager #: [phone] Office #: [phone]   Berline Lopes 02/18/2019, 2:08 PM

## 2019-02-18 NOTE — Progress Notes (Signed)
PT Cancellation Note  Patient Details Name: Evan Preston MRN: [id number] DOB: Aug 19, 1955   Cancelled Treatment:    Reason Eval/Treat Not Completed: Other (comment) . Pt refused to get up OOB this morning as he's "finally comfortable for the first time in 12 hours." Pt educated on the importance of OOB mobility especially with his desire to return home. RN did report pt ambulated with RN staff x2 yesterday. Pt continued to refused despite max encouragement and education. Acute PT to return as able to complete PT eval.  Lewis Shock, PT, DPT Acute Rehabilitation Services Pager #: [phone] Office #: [phone]   Iona Hansen 02/18/2019, 8:20 AM

## 2019-02-18 NOTE — Progress Notes (Addendum)
[address]       [place] [address]             [phone]      7 Days Post-Op Procedure(s) (LRB): EMERGENCY REDO STERNOTOMY (N/A) EMERGENCY REDO CORONARY ARTERY BYPASS GRAFTING (CABG) USING RIGHT GREATER SAPHENOUS VEIN HARVESTED ENDOSCOPICALLY (N/A) EMERGENCY REPAIR OF AORTIC PSEUDOANEURYSM (N/A) TRANSESOPHAGEAL ECHOCARDIOGRAM (TEE) (N/A) Subjective: Feels weak but improving, especially with ambulation  Objective: Vital signs in last 24 hours: Temp:  [97.6 F (36.4 C)-99 F (37.2 C)] 98.4 F (36.9 C) (01/27 0336) Pulse Rate:  [61-80] 80 (01/27 0800) Cardiac Rhythm: Normal sinus rhythm (01/27 0702) Resp:  [14-20] 14 (01/27 0800) BP: (133-158)/(82-94) 145/94 (01/27 0336) SpO2:  [94 %-100 %] 94 % (01/27 0802) Weight:  [947 kg] 117 kg (01/27 0702)  Hemodynamic parameters for last 24 hours:    Intake/Output from previous day: 01/26 0701 - 01/27 0700 In: 939.8 [P.O.:540; IV Piggyback:399.8] Out: 1150 [Urine:1150] Intake/Output this shift: No intake/output data recorded.  General appearance: alert, cooperative and no distress Heart: regular rate and rhythm Lungs: coarse with exp wheeze Abdomen: soft, nontender or distended Extremities: min edema Wound: incis healing well  Lab Results: Recent Labs    02/17/19 0208 02/18/19 0253  WBC 11.6* 11.0*  HGB 8.9* 9.3*  HCT 26.9* 29.0*  PLT 160 202   BMET:  Recent Labs    02/17/19 0208 02/18/19 0253  NA 140 139  K 3.4* 3.7  CL 94* 97*  CO2 36* 33*  GLUCOSE 108* 113*  BUN 15 12  CREATININE 1.05 0.82  CALCIUM 8.2* 8.3*    PT/INR: No results for input(s): LABPROT, INR in the last 72 hours. ABG    Component Value Date/Time   PHART 7.333 (L) 02/14/2019 0520   HCO3 29.0 (H) 02/14/2019 0520   TCO2 31 02/14/2019 0520   ACIDBASEDEF 4.0 (H) 02/12/2019 1549   O2SAT 63.5 02/16/2019 0433   CBG (last 3)  Recent Labs    02/17/19 0641 02/17/19 1615 02/18/19 0607  GLUCAP 166* 99 116*     Meds Scheduled Meds: . amiodarone  200 mg Oral BID  . aspirin EC  325 mg Oral Daily   Or  . aspirin  324 mg Per Tube Daily  . bisacodyl  10 mg Oral Daily   Or  . bisacodyl  10 mg Rectal Daily  . budesonide  0.5 mg Nebulization BID  . carvedilol  12.5 mg Oral BID WC  . Chlorhexidine Gluconate Cloth  6 each Topical Daily  . docusate sodium  200 mg Oral Daily  . enoxaparin (LOVENOX) injection  40 mg Subcutaneous Q24H  . furosemide  40 mg Intravenous Daily  . gabapentin  300 mg Oral QHS  . guaiFENesin  600 mg Oral BID  . insulin aspart  0-15 Units Subcutaneous TID WC  . insulin aspart  0-5 Units Subcutaneous QHS  . insulin detemir  10 Units Subcutaneous BID  . levalbuterol  1.25 mg Nebulization BID  . oxybutynin  5 mg Oral Daily  . pantoprazole  40 mg Oral Daily  . potassium chloride  40 mEq Oral BID  . sodium chloride flush  10-40 mL Intracatheter Q12H  . sodium chloride flush  3 mL Intravenous Q12H  . sorbitol  30 mL Oral q morning - 10a   Continuous Infusions: . sodium chloride Stopped (02/13/19 0855)  . sodium chloride    . sodium chloride 20 mL/hr at 02/13/19 2000  . sodium  chloride Stopped (02/16/19 0902)  . vancomycin 1,000 mg (02/18/19 0540)   PRN Meds:.sodium chloride, sodium chloride, ondansetron (ZOFRAN) IV, oxyCODONE, sodium chloride flush, traMADol  Xrays DG Chest Port 1 View  Result Date: 02/17/2019 CLINICAL DATA:  History of CABG EXAM: PORTABLE CHEST 1 VIEW COMPARISON:  Radiograph 02/16/2019 FINDINGS: Postsurgical changes related to prior CABG including intact and aligned sternotomy wires and multiple surgical clips projecting over the mediastinum. Surgical clips present in the right axilla as well as overlying skin staples. Removal of the previously seen right IJ catheter sheath. Stable cardiomediastinal contours accounting for differences in technique. There are few streaky opacities in the lung bases compatible with some residual subsegmental atelectatic  change. Improving edematous features with diminished vascular congestion. No visible pneumothorax or effusion. No acute osseous or soft tissue abnormality. IMPRESSION: Some residual atelectatic changes. With improving edema and diminished vascular congestion. Removal of a right IJ catheter sheath. Electronically Signed   By: Kreg Shropshire M.D.   On: 02/17/2019 06:19   [place] EKG SITE RITE  Result Date: 02/16/2019 If Site Rite image not attached, placement could not be confirmed due to current cardiac rhythm.   Assessment/Plan: S/P Procedure(s) (LRB): EMERGENCY REDO STERNOTOMY (N/A) EMERGENCY REDO CORONARY ARTERY BYPASS GRAFTING (CABG) USING RIGHT GREATER SAPHENOUS VEIN HARVESTED ENDOSCOPICALLY (N/A) EMERGENCY REPAIR OF AORTIC PSEUDOANEURYSM (N/A) TRANSESOPHAGEAL ECHOCARDIOGRAM (TEE) (N/A)  1 hemodyn stable, some HTN- will add low dose ARB, normal renal fxn. Sinus rhythm-will d/c epw's today 2 sats good on 4 liters, cont to push pulm toilet and cont nebs. 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 He is starting to walk now with physical therapy.  His pulmonary status is improving.  I would anticipate him ready for discharge in 2 to 3 days when he can ambulate 300 feet and be weaned off his nasal cannula.  He has history of DVT and a caval filter and will resume NOAC at discharge.  patient examined and medical record reviewed,agree with above note. Kathlee Nations Trigt III 02/18/2019

## 2019-02-18 NOTE — Plan of Care (Signed)
  Problem: Activity: Goal: Risk for activity intolerance will decrease Outcome: Progressing   Problem: Cardiac: Goal: Will achieve and/or maintain hemodynamic stability Outcome: Progressing   Problem: Clinical Measurements: Goal: Postoperative complications will be avoided or minimized Outcome: Progressing   Problem: Respiratory: Goal: Respiratory status will improve Outcome: Progressing   Problem: Skin Integrity: Goal: Wound healing without signs and symptoms of infection Outcome: Progressing Goal: Risk for impaired skin integrity will decrease Outcome: Progressing   Problem: Urinary Elimination: Goal: Ability to achieve and maintain adequate renal perfusion and functioning will improve Outcome: Progressing   Problem: Education: Goal: Knowledge of General Education information will improve Description: Including pain rating scale, medication(s)/side effects and non-pharmacologic comfort measures Outcome: Progressing   Problem: Health Behavior/Discharge Planning: Goal: Ability to manage health-related needs will improve Outcome: Progressing   Problem: Clinical Measurements: Goal: Ability to maintain clinical measurements within normal limits will improve Outcome: Progressing Goal: Will remain free from infection Outcome: Progressing Goal: Diagnostic test results will improve Outcome: Progressing Goal: Respiratory complications will improve Outcome: Progressing Goal: Cardiovascular complication will be avoided Outcome: Progressing   Problem: Activity: Goal: Risk for activity intolerance will decrease Outcome: Progressing   Problem: Nutrition: Goal: Adequate nutrition will be maintained Outcome: Progressing   Problem: Coping: Goal: Level of anxiety will decrease Outcome: Progressing   Problem: Elimination: Goal: Will not experience complications related to bowel motility Outcome: Progressing Goal: Will not experience complications related to urinary  retention Outcome: Progressing   Problem: Pain Managment: Goal: General experience of comfort will improve Outcome: Progressing   Problem: Safety: Goal: Ability to remain free from injury will improve Outcome: Progressing   Problem: Skin Integrity: Goal: Risk for impaired skin integrity will decrease Outcome: Progressing   

## 2019-02-18 NOTE — Progress Notes (Signed)
Pacer wires removed per MD order, 2 RN's in room, patient tolerated well, on bed rest for 1 hour, patient stated he understood.

## 2019-02-18 NOTE — Plan of Care (Signed)
  Problem: Education: Goal: Knowledge of disease or condition will improve Outcome: Progressing Goal: Knowledge of the prescribed therapeutic regimen will improve Outcome: Progressing   Problem: Activity: Goal: Risk for activity intolerance will decrease Outcome: Progressing   Problem: Clinical Measurements: Goal: Postoperative complications will be avoided or minimized Outcome: Progressing   Problem: Respiratory: Goal: Respiratory status will improve Outcome: Progressing   

## 2019-02-19 LAB — BASIC METABOLIC PANEL
Anion gap: 9 (ref 5–15)
BUN: 12 mg/dL (ref 8–23)
CO2: 32 mmol/L (ref 22–32)
Calcium: 8.5 mg/dL — ABNORMAL LOW (ref 8.9–10.3)
Chloride: 98 mmol/L (ref 98–111)
Creatinine, Ser: 0.88 mg/dL (ref 0.61–1.24)
GFR calc Af Amer: >60 mL/min (ref >60)
GFR calc non Af Amer: >60 mL/min (ref >60)
Glucose, Bld: 103 mg/dL — ABNORMAL HIGH (ref 70–99)
Potassium: 3.7 mmol/L (ref 3.5–5.1)
Sodium: 139 mmol/L (ref 135–145)

## 2019-02-19 LAB — GLUCOSE, CAPILLARY
Glucose-Capillary: 100 mg/dL — ABNORMAL HIGH (ref 70–99)
Glucose-Capillary: 86 mg/dL (ref 70–99)

## 2019-02-19 MED ORDER — RIVAROXABAN 20 MG PO TABS: 20.0000 mg | ORAL_TABLET | Freq: Every day | ORAL | 1 refills | Status: AC

## 2019-02-19 MED ORDER — CLINDAMYCIN HCL 300 MG PO CAPS: 300.0000 mg | ORAL_CAPSULE | Freq: Three times a day (TID) | ORAL | 0 refills | Status: AC

## 2019-02-19 MED ORDER — AMIODARONE HCL 200 MG PO TABS: 200.0000 mg | ORAL_TABLET | Freq: Two times a day (BID) | ORAL | 1 refills | Status: DC

## 2019-02-19 MED ORDER — CARVEDILOL 12.5 MG PO TABS: 12.5000 mg | ORAL_TABLET | Freq: Two times a day (BID) | ORAL | 1 refills | Status: AC

## 2019-02-19 MED ORDER — ASPIRIN 81 MG PO TBEC: 81.0000 mg | DELAYED_RELEASE_TABLET | Freq: Every day | ORAL | 1 refills | Status: AC

## 2019-02-19 MED ORDER — LOSARTAN POTASSIUM 25 MG PO TABS: 25.0000 mg | ORAL_TABLET | Freq: Every day | ORAL | 1 refills | Status: AC

## 2019-02-19 MED ORDER — POTASSIUM CHLORIDE CRYS ER 20 MEQ PO TBCR: 20.0000 meq | EXTENDED_RELEASE_TABLET | Freq: Every day | ORAL | 0 refills | Status: DC

## 2019-02-19 MED ORDER — OXYCODONE HCL 5 MG PO TABS: 5.0000 mg | ORAL_TABLET | Freq: Four times a day (QID) | ORAL | 0 refills | Status: DC | PRN

## 2019-02-19 MED ORDER — FUROSEMIDE 40 MG PO TABS: 40.0000 mg | ORAL_TABLET | Freq: Every day | ORAL | 0 refills | Status: DC

## 2019-02-19 NOTE — Progress Notes (Signed)
CARDIAC REHAB PHASE I   PRE:  Rate/Rhythm: 81 SR  BP:  Sitting: 113/75      SaO2: 92 RA  MODE:  Ambulation: 400 ft   POST:  Rate/Rhythm: 93 SR  BP:  Sitting: 135/78    SaO2: 90-94  Pt ambulated 498ft in hallway assist of one with front wheel walker. Pt took several short rest breaks c/o leg pain. Pt denies SOB, encouraged continued purse lipped breathing. D/c education completed with pt. Pt educated on importance of site care, and monitoring incision daily. Encouraged continued IS use, walks, and sternal precautions. Pt given in-the-tube sheet along with heart healthy diet. Pt declines smoking cessation tip sheet, states "he's never going back to them". Reviewed restrictions and exercise guidelines. Pt states he is going home with wife, and they are assisting him with getting a walker. Will send letter of interest to [facility] [place]. Pt is interested in participating in [facility]. Pt advised that Virtual Cardiac and Pulmonary Rehab is provided at no cost to the patient.  [id number] Reynold Bowen, RN BSN 02/19/2019 10:32 AM

## 2019-02-19 NOTE — Progress Notes (Addendum)
      [address]       [place] [address]             [phone]      8 Days Post-Op Procedure(s) (LRB): EMERGENCY REDO STERNOTOMY (N/A) EMERGENCY REDO CORONARY ARTERY BYPASS GRAFTING (CABG) USING RIGHT GREATER SAPHENOUS VEIN HARVESTED ENDOSCOPICALLY (N/A) EMERGENCY REPAIR OF AORTIC PSEUDOANEURYSM (N/A) TRANSESOPHAGEAL ECHOCARDIOGRAM (TEE) (N/A) Subjective: Wants to go home. States he has walked in the halls without issue.   Objective: Vital signs in last 24 hours: Temp:  [97.4 F (36.3 C)-98.7 F (37.1 C)] 98.1 F (36.7 C) (01/28 0740) Pulse Rate:  [72-86] 86 (01/28 0740) Cardiac Rhythm: Normal sinus rhythm (01/28 0403) Resp:  [15-21] 16 (01/28 0740) BP: (105-148)/(58-96) 125/85 (01/28 0740) SpO2:  [93 %-99 %] 95 % (01/28 0740) Weight:  [113.8 kg] 113.8 kg (01/28 0640)     Intake/Output from previous day: 01/27 0701 - 01/28 0700 In: 940 [P.O.:540; IV Piggyback:400] Out: 2325 [Urine:2325] Intake/Output this shift: No intake/output data recorded.  General appearance: alert, cooperative and no distress Heart: regular rate and rhythm, S1, S2 normal, no murmur, click, rub or gallop Lungs: clear to auscultation bilaterally Abdomen: soft, non-tender; bowel sounds normal; no masses,  no organomegaly Extremities: some upper extremity edema Wound: clean and dry  Lab Results: Recent Labs    02/17/19 0208 02/18/19 0253  WBC 11.6* 11.0*  HGB 8.9* 9.3*  HCT 26.9* 29.0*  PLT 160 202   BMET:  Recent Labs    02/18/19 0253 02/19/19 0257  NA 139 139  K 3.7 3.7  CL 97* 98  CO2 33* 32  GLUCOSE 113* 103*  BUN 12 12  CREATININE 0.82 0.88  CALCIUM 8.3* 8.5*    PT/INR: No results for input(s): LABPROT, INR in the last 72 hours. ABG    Component Value Date/Time   PHART 7.333 (L) 02/14/2019 0520   HCO3 29.0 (H) 02/14/2019 0520   TCO2 31 02/14/2019 0520   ACIDBASEDEF 4.0 (H) 02/12/2019 1549   O2SAT 63.5 02/16/2019 0433   CBG (last 3)  Recent  Labs    02/18/19 1601 02/18/19 2110 02/19/19 0606  GLUCAP 115* 123* 100*    Assessment/Plan: S/P Procedure(s) (LRB): EMERGENCY REDO STERNOTOMY (N/A) EMERGENCY REDO CORONARY ARTERY BYPASS GRAFTING (CABG) USING RIGHT GREATER SAPHENOUS VEIN HARVESTED ENDOSCOPICALLY (N/A) EMERGENCY REPAIR OF AORTIC PSEUDOANEURYSM (N/A) TRANSESOPHAGEAL ECHOCARDIOGRAM (TEE) (N/A)  1. BP well controlled. NSR. Continue ASA, Amio 200mg  BID, Statin, Coreg, and Cozaar 2. Pulm-On 1L [place] with good oxygen support. Continue to wean as able. COPD 3. Renal-creatinine 0.88, electrolytes okay 4. Endo-blood glucose has been well controlled. "pre-diabetic". Will resume Metformin at discharge.  5. DVT-has a caval filter. Plan to resume NOAC at discharge. Continue Lovenox 6. Continue IV Vanco for now for right PICC cellulitis.  7. ID- no recent fevers, Urine clear.  8. Continue PT/OT  Plan: Continue to wean oxygen. Will discuss antibiotics for home with Dr. . Will resume Xarelto and continue ASA 81 at discharge. Possibly home later today vs. Tomorrow morning.    LOS: 8 days    Donata Clay 02/19/2019  Doing well - DC instructions reviewed with patient patient examined and medical record reviewed,agree with above note. 02/21/2019 Trigt III 02/19/2019

## 2019-02-19 NOTE — Progress Notes (Signed)
Pt discharging to home, all discharge instructions provided to patient with no questions at this time. Pt being transported home via [place] [employer] service and voucher was provided to patient. No further needs at this time.

## 2019-02-19 NOTE — TOC Transition Note (Addendum)
Transition of Care [facility]) - CM/SW Discharge Note   Patient Details  Name: Evan Preston MRN: [id number] Date of Birth: 10-Sep-1955  [facility] Care [facility]) CM/SW Contact:  Leone Haven, RN Phone Number: 02/19/2019, 9:57 AM   Clinical Narrative:    NCM spoke with patient, he will need home oxygen and rolling walker and cab voucher .  Patient states he does not have a preference for the DME  And would like for [employer] to set it up for him. Referral made to [facility].  NCM informed Staff RN Danford Bad need ambulatory sats in system.  Awaiting sats.  Per sats patient does not need home oxygen but does need the rolling walke.    Final next level of care: Home/Self Care Barriers to Discharge: No Barriers Identified   Patient Goals and CMS Choice Patient states their goals for this hospitalization and ongoing recovery are:: get better      Discharge Placement                       Discharge Plan and Services                DME Arranged: Walker rolling, Oxygen DME Agency: [employer] Date DME Agency Contacted: 02/19/19 Time DME Agency Contacted: [phone] Representative spoke with at DME Agency: Ian Malkin HH Arranged: NA          Social Determinants of Health (SDOH) Interventions     Readmission Risk Interventions No flowsheet data found.

## 2019-02-19 NOTE — TOC Transition Note (Addendum)
Transition of Care [facility]) - CM/SW Discharge Note   Patient Details  Name: Evan Preston MRN: [id number] Date of Birth: [birth year]  Transition of Care [facility]) CM/SW Contact:  Leone Haven, RN Phone Number: 02/19/2019, 12:03 PM   Clinical Narrative:    NCM spoke with patient, he will need home oxygen and rolling walker and cab voucher .  Patient states he does not have a preference for the DME  And would like for [employer] to set it up for him. Referral made to [facility].  NCM informed Staff RN Danford Bad need ambulatory sats in system.  Awaiting sats.  Per sats patient does not need home oxygen but does need the rolling walker.  Patient also needs HHRN,HHPT, HHOT, he states he does not have a preference.  NCM made referral for [facility]. They were able to take referral.  Soc will begin 24 to 48 hrs post dc.  NCM received call from Tiffany with [facility] they can not take referral.  NCM made referral to [facility] with [facility].  Kandee Keen could not take referrral. NCM made referral to [facility] with [facility], she is able to take referral for HHPT/HHOT.     Final next level of care: Home w Home Health Services Barriers to Discharge: No Barriers Identified   Patient Goals and CMS Choice Patient states their goals for this hospitalization and ongoing recovery are:: get better CMS Medicare.gov Compare Post Acute Care list provided to:: Patient Choice offered to / list presented to : Patient  Discharge Placement                       Discharge Plan and Services                DME Arranged: Walker rolling DME Agency: [employer] Date DME Agency Contacted: 02/19/19 Time DME Agency Contacted: 1100 Representative spoke with at DME Agency: zach HH Arranged: RN, PT, OT HH Agency: [facility] Date HH Agency Contacted: 02/19/19 Time HH Agency Contacted: 1203 Representative spoke with at [facility] Agency: Tiffany  Social Determinants of Health (SDOH) Interventions      Readmission Risk Interventions No flowsheet data found.

## 2019-02-19 NOTE — Progress Notes (Signed)
Spoke with RN and made her aware the she can DC midline

## 2019-02-19 NOTE — Progress Notes (Signed)
Pharmacy Antibiotic Note  Evan Preston is a 64 y.o. male admitted on 02/11/2019 with chest pain in need of redo CABG.  Pharmacy has been consulted for vancomycin dosing for possible sternal wound cellulitis. Renal function is stable, no recurrent fevers. Discharge planned for today or tomorrow so will defer levels.  Plan: -Continue vancomycin 1000mg  IV q12h for now  Height: 6' 0.01" (182.9 cm) Weight: 250 lb 14.4 oz (113.8 kg) IBW/kg (Calculated) : 77.62  Temp (24hrs), Avg:98.1 F (36.7 C), Min:97.4 F (36.3 C), Max:98.7 F (37.1 C)  Recent Labs  Lab 02/14/19 0457 02/14/19 0457 02/15/19 0452 02/15/19 0452 02/16/19 0455 02/16/19 0552 02/16/19 1607 02/17/19 0208 02/18/19 0253 02/19/19 0257  WBC 14.4*  --  12.9*  --  8.6  --   --  11.6* 11.0*  --   CREATININE 1.24   < > 0.99   < >  --  0.97 1.01 1.05 0.82 0.88   < > = values in this interval not displayed.    Estimated Creatinine Clearance: 111.9 mL/min (by C-G formula based on SCr of 0.88 mg/dL).    Allergies  Allergen Reactions  . Penicillins Anaphylaxis  . Milk-Related Compounds Rash    Sour Cream    Thank you for allowing pharmacy to be a part of this patient's care.  02/21/19, PharmD, BCPS Clinical Pharmacist [phone] Please check AMION for all [facility] Pharmacy numbers 02/19/2019

## 2019-02-19 NOTE — Discharge Instructions (Signed)
Discharge Instructions:  1. You may shower, please wash incisions daily with soap and water and keep dry.  If you wish to cover wounds with dressing you may do so but please keep clean and change daily.  No tub baths or swimming until incisions have completely healed.  If your incisions become red or develop any drainage please call our office at [phone]  2. No Driving until cleared by Dr. Zenaida Niece Trigt's office and you are no longer using narcotic pain medications  3. Monitor your weight daily.. Please use the same scale and weigh at same time... If you gain 3-5 lbs in 48 hours with associated lower extremity swelling, please contact our office at [phone]  4. Fever of 101.5 for at least 24 hours, please contact our office at [phone]  5. Activity- up as tolerated, please walk at least 3 times per day.  Avoid strenuous activity, no lifting, pushing, or pulling with your arms over 8-10 lbs for a minimum of 6 weeks  6. If any questions or concerns arise, please do not hesitate to contact our office at [phone]

## 2019-02-19 NOTE — Progress Notes (Signed)
      [address]       [place] [address]             [phone]       Home health PT/OT/RN ordered for assistance with weaning of oxygen and with mobility. Once arranged patient can be discharged. Follow-up with our office has been arranged.    Jari Favre, pa-c

## 2019-02-19 NOTE — Evaluation (Signed)
Occupational Therapy Evaluation Patient Details Name: Evan Preston MRN: [id number] DOB: 07-30-1955 Today's Date: 02/19/2019    History of Present Illness Patient is a 64 year old male with a past medical history of hypertension, coronary artery disease status post CABG who presented to [facility] regional hospital with severe onset of central chest pain. 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 Pt underwent emergency redo sternotomy with CABG using R greater sphenous veing, also repair of aortic pseudoanerysm on 1/21.   Clinical Impression   Pt was independent prior to admission. Presents with edematous and weak R UE, impaired dynamic standing balance and difficulty accessing his feet for ADL. Pt is declining AE, will rely on his wife. Pt with difficulty recalling sternal precautions, reinforced. Instructed in HEP and edema management of R UE, performed retrograde massage and elevated on 2 pillows. Pt to d/c home today. Will defer further OT to [facility].    Follow Up Recommendations  Home health OT    Equipment Recommendations  3 in 1 bedside commode    Recommendations for Other Services       Precautions / Restrictions Precautions Precautions: Sternal Precaution Comments: educated in sternal precautions, poor recall Restrictions Weight Bearing Restrictions: Yes      Mobility Bed Mobility               General bed mobility comments: pt received in chair  Transfers Overall transfer level: Needs assistance Equipment used: Rolling walker (2 wheeled) Transfers: Sit to/from Stand Sit to Stand: Min assist         General transfer comment: verbal cues to rock forward onto feet and push up from feet not push up with arm, pt adhered and completed with minimal assist    Balance Overall balance assessment: Needs assistance   Sitting balance-Leahy Scale: Good       Standing balance-Leahy Scale: Fair Standing balance comment: at sink                            ADL either performed or assessed with clinical judgement   ADL Overall ADL's : Needs assistance/impaired Eating/Feeding: Minimal assistance;Cueing for compensatory techinques Eating/Feeding Details (indicate cue type and reason): assist to open packages Grooming: Standing;Minimal assistance(cleaned glasses)   Upper Body Bathing: Minimal assistance;Sitting Upper Body Bathing Details (indicate cue type and reason): recommended long handled bath sponge for back Lower Body Bathing: Minimal assistance;Sit to/from stand Lower Body Bathing Details (indicate cue type and reason): recommended long handled bath sponge and reacher Upper Body Dressing : Minimal assistance;Sitting   Lower Body Dressing: Moderate assistance;Sit to/from stand Lower Body Dressing Details (indicate cue type and reason): pt is not interested in AE, will rely on his wife Toilet Transfer: Min guard;Ambulation;RW;BSC Toilet Transfer Details (indicate cue type and reason): educated in use of 3 in1 over toilet         Functional mobility during ADLs: Min guard;Rolling walker       Vision Baseline Vision/History: Wears glasses Wears Glasses: At all times Patient Visual Report: No change from baseline       Perception     Praxis      Pertinent Vitals/Pain Pain Assessment: No/denies pain     Hand Dominance Left   Extremity/Trunk Assessment Upper Extremity Assessment Upper Extremity Assessment: RUE deficits/detail RUE Deficits / Details: weakness, edematous, educated in HEP and edema management RUE Coordination: decreased fine motor   Lower Extremity Assessment Lower Extremity Assessment: Defer to PT evaluation  Cervical / Trunk Assessment Cervical / Trunk Assessment: Other exceptions Cervical / Trunk Exceptions: sternal incision, obesity   Communication Communication Communication: No difficulties   Cognition Arousal/Alertness: Awake/alert Behavior During Therapy: Flat affect Overall Cognitive  Status: Impaired/Different from baseline Area of Impairment: Memory                     Memory: Decreased short-term memory;Decreased recall of precautions             General Comments       Exercises     Shoulder Instructions      Home Living Family/patient expects to be discharged to:: Private residence Living Arrangements: Spouse/significant other Available Help at Discharge: Family;Available PRN/intermittently Type of Home: House Home Access: Stairs to enter [employer] of Steps: 5 Entrance Stairs-Rails: Right Home Layout: One level     Bathroom Shower/Tub: Occupational psychologist: Standard     Home Equipment: Cane - single point;Shower seat          Prior Functioning/Environment Level of Independence: Independent        Comments: Drives, works.        OT Problem List: Decreased strength;Impaired balance (sitting and/or standing);Decreased coordination;Decreased cognition;Decreased knowledge of use of DME or AE;Obesity;Impaired UE functional use      OT Treatment/Interventions:      OT Goals(Current goals can be found in the care plan section) Acute Rehab OT Goals Patient Stated Goal: go home and sleep in his own bed  OT Frequency:     Barriers to D/C:            Co-evaluation              AM-PAC OT "6 Clicks" Daily Activity     Outcome Measure Help from another person eating meals?: A Little Help from another person taking care of personal grooming?: A Little Help from another person toileting, which includes using toliet, bedpan, or urinal?: A Little Help from another person bathing (including washing, rinsing, drying)?: A Lot Help from another person to put on and taking off regular upper body clothing?: A Little Help from another person to put on and taking off regular lower body clothing?: A Lot 6 Click Score: 16   End of Session Equipment Utilized During Treatment: Rolling walker  Activity  Tolerance: Patient tolerated treatment well Patient left: in chair;with call bell/phone within reach  OT Visit Diagnosis: Unsteadiness on feet (R26.81);Muscle weakness (generalized) (M62.81)                Time: 0102-7253 OT Time Calculation (min): 23 min Charges:  OT General Charges $OT Visit: 1 Visit OT Evaluation $OT Eval Moderate Complexity: 1 Mod OT Treatments $Self Care/Home Management : 8-22 mins  Nestor Lewandowsky, OTR/L Acute Rehabilitation Services Pager: [phone] Office: [phone] Malka So 02/19/2019, 10:37 AM

## 2019-02-19 NOTE — Progress Notes (Signed)
SATURATION QUALIFICATIONS: (This note is used to comply with regulatory documentation for home oxygen)  Patient Saturations on Room Air at Rest = 92%  Patient Saturations on Room Air while Ambulating = 90-94%  Please briefly explain why patient needs home oxygen: No, pt does not need oxygen. Pt able to maintain sats while ambulating on room air. Pts sats noted to be lower with rest, RN aware.

## 2019-02-20 ENCOUNTER — Other Ambulatory Visit (HOSPITAL_COMMUNITY): Payer: Self-pay

## 2019-02-20 ENCOUNTER — Encounter (HOSPITAL_COMMUNITY): Payer: Self-pay | Admitting: *Deleted

## 2019-02-20 DIAGNOSIS — Z951 Presence of aortocoronary bypass graft: Secondary | ICD-10-CM

## 2019-02-20 NOTE — Progress Notes (Signed)
Referral received and pending verification of MD signature.   Pt follow up appt with Cardiology - 03/02/18 and CV surgical 2/24. Due to the senior leadership directed instructions to pause onsite cardiac rehab in response to the surge of Covid + patients within the health system staff were deployed to support health system and community needs.  Will wait to check pt insurance benefits and eligibility once onsite cardiac rehab is permitted to reopen. Pt will be contacted for scheduling [facility] upon satisfactorily completing their follow up appt on 2/9 and 2/24.  Alanson Aly, BSN Cardiac and Emergency planning/management officer

## 2019-02-21 DIAGNOSIS — Z48812 Encounter for surgical aftercare following surgery on the circulatory system: Secondary | ICD-10-CM | POA: Diagnosis not present

## 2019-02-27 ENCOUNTER — Other Ambulatory Visit: Payer: Self-pay | Admitting: Physician Assistant

## 2019-02-27 MED ORDER — TRAMADOL HCL 50 MG PO TABS: 50.0000 mg | ORAL_TABLET | Freq: Four times a day (QID) | ORAL | 0 refills | Status: DC | PRN

## 2019-02-27 NOTE — Progress Notes (Signed)
      [address]       [place] [address]             [phone]        Prescription for Tramadol 50mg  q 6 hours PRN send to the patient's pharmacy. Dr. aware.     Donata Clay, PA-C

## 2019-03-03 ENCOUNTER — Ambulatory Visit: Payer: 59 | Admitting: Internal Medicine

## 2019-03-11 ENCOUNTER — Ambulatory Visit: Payer: 59 | Admitting: Cardiothoracic Surgery

## 2019-03-16 ENCOUNTER — Telehealth (HOSPITAL_COMMUNITY): Payer: Self-pay

## 2019-03-16 NOTE — Progress Notes (Deleted)
Virtual Visit via Video Note   This visit type was conducted due to national recommendations for restrictions regarding the COVID-19 Pandemic (e.g. social distancing) in an effort to limit this patient's exposure and mitigate transmission in our community.  Due to his co-morbid illnesses, this patient is at least at moderate risk for complications without adequate follow up.  This format is felt to be most appropriate for this patient at this time.  All issues noted in this document were discussed and addressed.  A limited physical exam was performed with this format.  Please refer to the patient's chart for his consent to telehealth for [facility].  Evaluation Performed:  Follow-up visit  This visit type was conducted due to national recommendations for restrictions regarding the COVID-19 Pandemic (e.g. social distancing).  This format is felt to be most appropriate for this patient at this time.  All issues noted in this document were discussed and addressed.  No physical exam was performed (except for noted visual exam findings with Video Visits).  Please refer to the patient's chart (MyChart message for video visits and phone note for telephone visits) for the patient's consent to telehealth for [facility].  Date:  03/16/2019   ID:  Evan Preston, DOB [birth year], MRN [id number]  Patient Location:  Home  Provider location:   [place]  PCP:  Quintella Reichert, MD  Cardiologist:  No primary care provider on file.  Electrophysiologist:  None   Chief Complaint:  Cardiac Pseudoaneurysm, HTN, tobacco abuse  History of Present Illness:    Evan Preston is a 64 y.o. male who presents via audio/video conferencing for a telehealth visit today.    This is 31 male with a hx of HTN, COPD with ongoing tobacco abuse, DVT s/p IVC filter and generalized anxiety disorder.  She has a hx ASCAD s/p CABG in [place] in 2002 and presented recently to [facility] with complaints of sudden onset of chest pain  associated with SOB with radiation to right arm and shoulder as well as dry heaves.  In ER CT was done showing an ascending aortic pseudoaneurysm in the region of the RCA anastamosis with a tear that was partially contained.    He was transferred to [facility] and admitted by CVTS and underwent emergent redo CABG with SVG to RCA and repair of aortic pseudoaneurysm.  Hospital course complicated by volume overload and treated with IV Lasix.  He developed afib and was loaded on Amio and Carvedilol.  He was on Milrinone for a period of time.  He also had cellulitis of his redo sternotomy incision and was started on Vanc.  He was on DOAC chronically for DVT which was restarted at discharge.    He is now referred by Dr. Donata Clay to establish cardiac care.  He is here today for followup and is doing well.  He denies any chest pain or pressure, SOB, DOE, PND, orthopnea, LE edema, dizziness, palpitations or syncope. He is compliant with his meds and is tolerating meds with no SE.    The patient does not have symptoms concerning for COVID-19 infection (fever, chills, cough, or new shortness of breath).    Prior CV studies:   The following studies were reviewed today:  2D echo ordered by Dr. Donata Clay 02/11/19 was personally reviwed by myself, hospital op note from CVTS, hospital notes from CVTS  Past Medical History:  Diagnosis Date  . Anxiety   . COPD (chronic obstructive pulmonary disease) (HCC)   . Coronary  artery disease   . Diabetes mellitus without complication (Evan Preston)   . Duodenal ulcer   . DVT (deep venous thrombosis) ([place])   . Hx of CABG   . Hypertension    Past Surgical History:  Procedure Laterality Date  . CORONARY ARTERY BYPASS GRAFT    . CORONARY ARTERY BYPASS GRAFT N/A 02/11/2019   Procedure: EMERGENCY REDO CORONARY ARTERY BYPASS GRAFTING (CABG) USING RIGHT GREATER SAPHENOUS VEIN HARVESTED ENDOSCOPICALLY;  Surgeon: Ivin Poot, MD;  Location: [place];  Service: Open Heart Surgery;   Laterality: N/A;  . TEE WITHOUT CARDIOVERSION N/A 02/11/2019   Procedure: TRANSESOPHAGEAL ECHOCARDIOGRAM (TEE);  Surgeon: Prescott Gum, Collier Salina, MD;  Location: [place];  Service: Open Heart Surgery;  Laterality: N/A;  . THORACIC AORTIC ANEURYSM REPAIR N/A 02/11/2019   Procedure: EMERGENCY REPAIR OF AORTIC PSEUDOANEURYSM;  Surgeon: Ivin Poot, MD;  Location: [place];  Service: Open Heart Surgery;  Laterality: N/A;     No outpatient medications have been marked as taking for the 03/17/19 encounter (Appointment) with [name], MD.     Allergies:   Penicillins and Milk-related compounds   Social History   Tobacco Use  . Smoking status: Current Every Day Smoker    Types: Cigarettes  . Smokeless tobacco: Never Used  Substance Use Topics  . Alcohol use: Not on file  . Drug use: Not on file     Family Hx: The patient's family history is not on file.  ROS:   Please see the history of present illness.     All other systems reviewed and are negative.   Labs/Other Tests and Data Reviewed:    Recent Labs: 02/13/2019: Magnesium 1.8 02/18/2019: ALT 43; Hemoglobin 9.3; Platelets 202 02/19/2019: BUN 12; Creatinine, Ser 0.88; Potassium 3.7; Sodium 139   Recent Lipid Panel No results found for: CHOL, TRIG, HDL, CHOLHDL, LDLCALC, LDLDIRECT  Wt Readings from Last 3 Encounters:  02/19/19 250 lb 14.4 oz (113.8 kg)  02/11/19 253 lb (114.8 kg)     Objective:    Vital Signs:  There were no vitals taken for this visit.   CONSTITUTIONAL:  Well nourished, well developed male in no acute distress.  EYES: anicteric MOUTH: oral mucosa is pink RESPIRATORY: Normal respiratory effort, symmetric expansion CARDIOVASCULAR: No peripheral edema SKIN: No rash, lesions or ulcers MUSCULOSKELETAL: no digital cyanosis NEURO: Cranial Nerves II-XII grossly intact, moves all extremities PSYCH: Intact judgement and insight.  A&O x 3, Mood/affect appropriate   ASSESSMENT & PLAN:    1.  Ascending aortic  pseudoaneurysm -s/p emergent CABG with SVG to RCA with repair of pseudoaneurysm -BP well controlled -I personally reviewed the pre- op echo showing normal LVF with EF 60-65% with G1DD and mildly dilated sinuses of Valsalva measuring 39mm.  -continue BB -repeat 2D echo for baseline post op -continue statin  2.  ASCAD -s/p remote CABG in 2002 in [place] -denies any further chest pain  -continue ASA, statin, BB  3.  HTN -BP well controlled -continue Carvedilol 12.5mg  BID, Losartan 25mg  daily -hospital labs were personally reviewed by me and showed a stable creatinine of 0.88 and K+ 37 on 02/19/19  4.  Post op atrial fibrillation -he has not had any palpitations since discharge -I personally reviewed the last EKG done in the hospital on 02/11/2018 that showed NSR with diffuse ST/T wave abnormality consistent with inferior and anterolateral ischemia -he had post op afib after surgery and is now on Amio 200mg  BID -decrease to Amio 200mg  daily.  -  he needs a baseline TSH -ALT was 43 last month -will continue Amio for 3 months total and then get an event monitor to make sure he is not having any silent PAF.  If monitor ok then would prefer to wean off Amio given his young age and risk of Amio toxicity if used long term.  -continue Xarelto 20mg  daily -denies any bleeding problems on the DOAC  5.  HLD -LDL goal < 70 -check FLP  -continue Crestor 40mg  daily and Zetia 10mg  daily  6.  Hx of DVT -s/p IVC filter -continue Xarelto  7.  DM type 2 -HbA1C goal < 7% -check HbA1C -continue Metformin 500mg  BID  COVID-19 Education: The signs and symptoms of COVID-19 were discussed with the patient and how to seek care for testing (follow up with PCP or arrange E-visit).  The importance of social distancing was discussed today.  Patient Risk:   After full review of this patient's clinical status, I feel that they are at least moderate risk at this time.  Time:   Today, I have spent 40 minutes  with telemedicine discussing medical problems including CAD, cardiac pseudoaneurysm, post op afib, HTN, HLD, DM and reviewing patient's chart including operative note and hospital notes from admission 01/2019, personally reviewing 2D echo 02/11/2019, personally reviewing EKG 02/11/2018 (interpretation under # 4, hospital labs.  Medication Adjustments/Labs and Tests Ordered: Current medicines are reviewed at length with the patient today.  Concerns regarding medicines are outlined above.  Tests Ordered: No orders of the defined types were placed in this encounter.  Medication Changes: No orders of the defined types were placed in this encounter.   Disposition:  Follow up in 3 month(s)  Signed, , MD  03/16/2019 8:08 PM     [facility]

## 2019-03-16 NOTE — Telephone Encounter (Signed)
Pt insurance is active and benefits verified through [employer] $50, DED $500/$500 met, out of pocket $3,000/$1,271.25 met, co-insurance 0%. no pre-authorization required. Passport, 03/16/2019_0 :43pm, REF# [id number]  Will contact patient to see if he is interested in the Cardiac Rehab Program. If interested, patient will need to complete follow up appt. Once completed, patient will be contacted for scheduling upon review by the RN Navigator.

## 2019-03-17 ENCOUNTER — Telehealth: Payer: 59 | Admitting: Cardiology

## 2019-03-17 ENCOUNTER — Other Ambulatory Visit: Payer: Self-pay | Admitting: Cardiothoracic Surgery

## 2019-03-17 DIAGNOSIS — I253 Aneurysm of heart: Secondary | ICD-10-CM

## 2019-03-18 ENCOUNTER — Other Ambulatory Visit: Payer: Self-pay

## 2019-03-18 ENCOUNTER — Ambulatory Visit
Admission: RE | Admit: 2019-03-18 | Discharge: 2019-03-18 | Disposition: A | Discharge status: Home / Self Care | Payer: 59 | Source: Ambulatory Visit | Attending: Cardiothoracic Surgery | Admitting: Cardiothoracic Surgery

## 2019-03-18 ENCOUNTER — Ambulatory Visit (INDEPENDENT_AMBULATORY_CARE_PROVIDER_SITE_OTHER): Payer: Self-pay | Admitting: Cardiothoracic Surgery

## 2019-03-18 ENCOUNTER — Encounter: Payer: Self-pay | Admitting: Cardiothoracic Surgery

## 2019-03-18 VITALS — BP 113/70 | HR 89 | Temp 97.3°F | Resp 16 | Ht 72.0 in | Wt 263.8 lb

## 2019-03-18 DIAGNOSIS — Z09 Encounter for follow-up examination after completed treatment for conditions other than malignant neoplasm: Secondary | ICD-10-CM

## 2019-03-18 DIAGNOSIS — Z951 Presence of aortocoronary bypass graft: Secondary | ICD-10-CM

## 2019-03-18 DIAGNOSIS — I253 Aneurysm of heart: Secondary | ICD-10-CM

## 2019-03-18 NOTE — Progress Notes (Signed)
PCP is [name], MD Referring Provider is Carrie Mew, MD  Chief Complaint  Patient presents with  . Routine Post Op    s/p EMERGENT REDO CABG X 1, REPAIR CARDIAC PSEUDOANEURYSM 02/12/19 with a CXR    HPI: Patient presents for scheduled 1 month postop visit after emergency redo sternotomy for repair of a large 8 cm cardiac pseudoaneurysm based at the origin of an old right coronary vein graft and redo saphenous vein graft to the right coronary artery. He had postoperative atrial fibrillation converting to sinus rhythm with amiodarone. He has done well at home without recurrent chest pain.  Surgical incisions are healing well.  He has no symptoms of angina or CHF. He is abstain from smoking cigarettes completely and has lost approximately 15-20 pounds of needed weight loss. He is walking regularly and ready to start driving and normal activities. His main complaint is some neuropathic pain/numbness of his right hand related to the right axillary artery exposure for aortic cannulation. Chest x-ray today is clear without pleural effusion, sternal wires intact. Patient had previous multivessel CABG in [place].  He did not have a cardiac cath preoperatively-emergency surgery was based on a CTA.  Past Medical History:  Diagnosis Date  . Anxiety   . COPD (chronic obstructive pulmonary disease) ([place])   . Coronary artery disease   . Diabetes mellitus without complication ([place])   . Duodenal ulcer   . DVT (deep venous thrombosis) ([place])   . Hx of CABG   . Hypertension     Past Surgical History:  Procedure Laterality Date  . CORONARY ARTERY BYPASS GRAFT    . CORONARY ARTERY BYPASS GRAFT N/A 02/11/2019   Procedure: EMERGENCY REDO CORONARY ARTERY BYPASS GRAFTING (CABG) USING RIGHT GREATER SAPHENOUS VEIN HARVESTED ENDOSCOPICALLY;  Surgeon: Ivin Poot, MD;  Location: [place];  Service: Open Heart Surgery;  Laterality: N/A;  . TEE WITHOUT CARDIOVERSION N/A 02/11/2019   Procedure:  TRANSESOPHAGEAL ECHOCARDIOGRAM (TEE);  Surgeon: Prescott Gum, Collier Salina, MD;  Location: [place];  Service: Open Heart Surgery;  Laterality: N/A;  . THORACIC AORTIC ANEURYSM REPAIR N/A 02/11/2019   Procedure: EMERGENCY REPAIR OF AORTIC PSEUDOANEURYSM;  Surgeon: Ivin Poot, MD;  Location: [place];  Service: Open Heart Surgery;  Laterality: N/A;    No family history on file.  Social History Social History   Tobacco Use  . Smoking status: Current Every Day Smoker    Types: Cigarettes  . Smokeless tobacco: Never Used  Substance Use Topics  . Alcohol use: Not on file  . Drug use: Not on file    Current Outpatient Medications  Medication Sig Dispense Refill  . acetaminophen (TYLENOL) 325 MG tablet Take 650 mg by mouth 3 (three) times daily as needed for mild pain or fever.    Marland Kitchen albuterol (VENTOLIN HFA) 108 (90 Base) MCG/ACT inhaler Inhale 2 puffs into the lungs every 4 (four) hours as needed for wheezing or shortness of breath.    Marland Kitchen amiodarone (PACERONE) 200 MG tablet Take 1 tablet (200 mg total) by mouth 2 (two) times daily. 60 tablet 1  . aspirin EC 81 MG EC tablet Take 1 tablet (81 mg total) by mouth daily. 30 tablet 1  . beclomethasone (QVAR) 80 MCG/ACT inhaler Inhale 1 puff into the lungs 2 (two) times daily.    . carboxymethylcellulose 1 % ophthalmic solution Apply 1 drop to eye 4 (four) times daily.    . carvedilol (COREG) 12.5 MG tablet Take 1 tablet (12.5 mg  total) by mouth 2 (two) times daily with a meal. 60 tablet 1  . ezetimibe (ZETIA) 10 MG tablet Take 10 mg by mouth daily.    Marland Kitchen gabapentin (NEURONTIN) 300 MG capsule Take 300 mg by mouth at bedtime.    Marland Kitchen losartan (COZAAR) 25 MG tablet Take 1 tablet (25 mg total) by mouth daily. 30 tablet 1  . metFORMIN (GLUCOPHAGE) 500 MG tablet Take 500 mg by mouth 2 (two) times daily with a meal.    . nicotine polacrilex (COMMIT) 4 MG lozenge Take 4 mg by mouth as directed.    Marland Kitchen oxybutynin (DITROPAN-XL) 5 MG 24 hr tablet Take 5 mg by mouth daily.     . pantoprazole (PROTONIX) 40 MG tablet Take 40 mg by mouth daily.    . potassium chloride SA (KLOR-CON) 20 MEQ tablet Take 1 tablet (20 mEq total) by mouth daily. 5 tablet 0  . rivaroxaban (XARELTO) 20 MG TABS tablet Take 1 tablet (20 mg total) by mouth daily with lunch. 30 tablet 1  . rosuvastatin (CRESTOR) 40 MG tablet Take 40 mg by mouth at bedtime.    Marland Kitchen tiotropium (SPIRIVA) 18 MCG inhalation capsule Place 36 mcg into inhaler and inhale daily.    . traMADol (ULTRAM) 50 MG tablet Take 1 tablet (50 mg total) by mouth every 6 (six) hours as needed. 30 tablet 0  . venlafaxine XR (EFFEXOR-XR) 75 MG 24 hr capsule Take 225 mg by mouth daily.    . Vitamin D3 (VITAMIN D) 25 MCG tablet Take 1,000 Units by mouth daily.     No current facility-administered medications for this visit.    Allergies  Allergen Reactions  . Penicillins Anaphylaxis  . Milk-Related Compounds Rash    Sour Cream    Review of Systems  Patient is on chronic Xarelto for history of DVT and previous vena cava filter. No fever or shortness of breath No edema Strength and exercise tolerance improving Anxious to increase his activities  BP 113/70 (BP Location: Left Arm, Patient Position: Sitting, Cuff Size: Normal)   Pulse 89   Temp (!) 97.3 F (36.3 C)   Resp 16   Ht 6' (1.829 m)   Wt 263 lb 12 oz (119.6 kg)   SpO2 96% Comment: RA  BMI 35.77 kg/m  Physical Exam      Exam    General- alert and comfortable.  Sternal and leg incisions well-healed.  Right axillary incision healing-skin staples removed today.    Neck- no JVD, no cervical adenopathy palpable, no carotid bruit   Lungs- clear without rales, wheezes   Cor- regular rate and rhythm, no murmur , gallop   Abdomen- soft, non-tender   Extremities - warm, non-tender, minimal edema   Neuro- oriented, appropriate, no focal weakness   Diagnostic Tests: Chest x-ray image personally reviewed results as noted above  Impression: Excellent early recovery after  emergency redo sternotomy, repair of cardiac pseudoaneurysm and redo CABG x1 The patient is able to drive and do normal activities but he is not  medically ready to return to work or complete continuous activities for hours at a time.  Plan: Patient will stop the amiodarone stop Lasix and potassium.  He will stop the Ditropan which has caused significant polyuria.  He will continue his Crestor aspirin carvedilol, losartan, Zetia.  He will return for follow-up exam in 4 weeks.  Mikey Bussing, MD [facility] [phone]

## 2019-03-23 ENCOUNTER — Ambulatory Visit: Payer: 59 | Admitting: Cardiology

## 2019-04-15 ENCOUNTER — Ambulatory Visit: Payer: Self-pay | Admitting: Cardiothoracic Surgery

## 2019-04-22 ENCOUNTER — Encounter: Payer: Self-pay | Admitting: Cardiothoracic Surgery

## 2019-04-22 ENCOUNTER — Telehealth (HOSPITAL_COMMUNITY): Payer: Self-pay

## 2019-04-22 ENCOUNTER — Ambulatory Visit (INDEPENDENT_AMBULATORY_CARE_PROVIDER_SITE_OTHER): Payer: Self-pay | Admitting: Cardiothoracic Surgery

## 2019-04-22 ENCOUNTER — Other Ambulatory Visit: Payer: Self-pay

## 2019-04-22 DIAGNOSIS — G540 Brachial plexus disorders: Secondary | ICD-10-CM | POA: Insufficient documentation

## 2019-04-22 NOTE — Telephone Encounter (Signed)
Attempted to contact pt in regards to CR. LMTCB 

## 2019-04-22 NOTE — Progress Notes (Signed)
PCP is Quintella Reichert, MD Referring Provider is Sharman Cheek, MD  Chief Complaint  Patient presents with  . 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  Patient is progressing exercise tolerance, no recurrent symptoms of angina and no symptoms of CHF.  His main complaint is neuropathic pain in his right arm and hand related to the right axillary artery incision needed for emergency arterial cannulation for cardiopulmonary bypass.  His right hand grip is still weak and cannot do many of his ADLs with his dominant right hand.  He is not yet safe to drive.  Patient has had significant nocturnal neuropathic pain for which he takes gabapentin 300 mg.  Patient will continue his PT exercises and increase his gabapentin p.m. dose up to 600 mg.  Past Medical History:  Diagnosis Date  . Anxiety   . COPD (chronic obstructive pulmonary disease) (HCC)   . Coronary artery disease   . Diabetes mellitus without complication (HCC)   . Duodenal ulcer   . DVT (deep venous thrombosis) (HCC)   . Hx of CABG   . Hypertension     Past Surgical History:  Procedure Laterality Date  . CORONARY ARTERY BYPASS GRAFT    . CORONARY ARTERY BYPASS GRAFT N/A 02/11/2019   Procedure: EMERGENCY REDO CORONARY ARTERY BYPASS GRAFTING (CABG) USING RIGHT GREATER SAPHENOUS VEIN HARVESTED ENDOSCOPICALLY;  Surgeon: Kerin Perna, MD;  Location: [facility] OR;  Service: Open Heart Surgery;  Laterality: N/A;  . TEE WITHOUT CARDIOVERSION N/A 02/11/2019   Procedure: TRANSESOPHAGEAL ECHOCARDIOGRAM (TEE);  Surgeon: Donata Clay, Theron Arista, MD;  Location: [facility] OR;  Service: Open Heart Surgery;  Laterality: N/A;  . THORACIC AORTIC ANEURYSM REPAIR N/A 02/11/2019   Procedure: EMERGENCY REPAIR OF AORTIC PSEUDOANEURYSM;  Surgeon: Kerin Perna, MD;  Location: [facility]  OR;  Service: Open Heart Surgery;  Laterality: N/A;    History reviewed. No pertinent family history.  Social History Social History   Tobacco Use  . Smoking status: Current Every Day Smoker    Types: Cigarettes  . Smokeless tobacco: Never Used  Substance Use Topics  . Alcohol use: Not on file  . Drug use: Not on file    Current Outpatient Medications  Medication Sig Dispense Refill  . acetaminophen (TYLENOL) 325 MG tablet Take 650 mg by mouth 3 (three) times daily as needed for mild pain or fever.    Marland Kitchen albuterol (VENTOLIN HFA) 108 (90 Base) MCG/ACT inhaler Inhale 2 puffs into the lungs every 4 (four) hours as needed for wheezing or shortness of breath.    Marland Kitchen aspirin EC 81 MG EC tablet Take 1 tablet (81 mg total) by mouth daily. 30 tablet 1  . beclomethasone (QVAR) 80 MCG/ACT inhaler Inhale 1 puff into the lungs 2 (two) times daily.    . carboxymethylcellulose 1 % ophthalmic solution Apply 1 drop to eye 4 (four) times daily.    . carvedilol (COREG) 12.5 MG tablet Take 1 tablet (12.5 mg total) by mouth 2 (two) times daily with a meal. 60 tablet 1  . ezetimibe (ZETIA) 10 MG tablet Take 10 mg by mouth daily.    Marland Kitchen gabapentin (NEURONTIN) 300 MG capsule Take 300 mg by mouth at bedtime.    Marland Kitchen losartan (COZAAR) 25 MG tablet Take 1 tablet (25 mg total) by mouth daily.  30 tablet 1  . metFORMIN (GLUCOPHAGE) 500 MG tablet Take 500 mg by mouth 2 (two) times daily with a meal.    . nicotine polacrilex (COMMIT) 4 MG lozenge Take 4 mg by mouth as directed.    . pantoprazole (PROTONIX) 40 MG tablet Take 40 mg by mouth daily.    . rivaroxaban (XARELTO) 20 MG TABS tablet Take 1 tablet (20 mg total) by mouth daily with lunch. 30 tablet 1  . rosuvastatin (CRESTOR) 40 MG tablet Take 40 mg by mouth at bedtime.    Marland Kitchen tiotropium (SPIRIVA) 18 MCG inhalation capsule Place 36 mcg into inhaler and inhale daily.    . traMADol (ULTRAM) 50 MG tablet Take 1 tablet (50 mg total) by mouth every 6 (six) hours as needed.  30 tablet 0  . venlafaxine XR (EFFEXOR-XR) 75 MG 24 hr capsule Take 225 mg by mouth daily.    . Vitamin D3 (VITAMIN D) 25 MCG tablet Take 1,000 Units by mouth daily.     No current facility-administered medications for this visit.    Allergies  Allergen Reactions  . Penicillins Anaphylaxis  . Milk-Related Compounds Rash    Sour Cream    Review of Systems  No fever No chest pain No symptoms of COVID-19-no shortness of breath fever productive cough pleuritic pain or loss of taste Patient received first dose of Covid vaccine recently. No ankle edema No palpitations No presyncope, orthostatic dizziness  BP (!) 132/91 (BP Location: Right Arm)   Pulse (!) 102   Temp (!) 97.5 F (36.4 C) (Temporal)   Resp 20   Ht 6' (1.829 m)   Wt 229 lb (103.9 kg)   SpO2 98% Comment: RA  BMI 31.06 kg/m  Physical Exam         Exam    General- alert and comfortable.  Redo sternotomy and endoscopic vein harvest site incision is healing well.  Right axillary artery incision healing well.    Neck- no JVD, no cervical adenopathy palpable, no carotid bruit   Lungs- clear without rales, wheezes   Cor- regular rate and rhythm, no murmur , gallop   Abdomen- soft, non-tender   Extremities - warm, non-tender, minimal edema   Neuro- oriented, appropriate, persistent but improved focal weakness of the right hand grip and right wrist flexion    Diagnostic Tests: None  Impression: Patient is blood pressures well controlled.  Cardiac status is stable with sinus rhythm, clear lungs and no edema. Is weak right dominant hand is affecting his ADLs.  I reassured him that with time the neuropathic pain and weakness will recover.  He will increase his gabapentin from 300 mg to 600 mg each night so he is able to sleep.  He will return in 2 months for review progress. Plan:   Len Childs, MD [facility] [phone]

## 2019-06-17 ENCOUNTER — Ambulatory Visit: Payer: 59 | Admitting: Cardiothoracic Surgery

## 2019-06-17 ENCOUNTER — Encounter: Payer: Self-pay | Admitting: Cardiothoracic Surgery

## 2019-06-17 ENCOUNTER — Encounter: Payer: 59 | Admitting: Cardiothoracic Surgery

## 2019-06-17 ENCOUNTER — Other Ambulatory Visit: Payer: Self-pay

## 2019-06-17 VITALS — BP 150/100 | HR 96 | Temp 97.8°F | Resp 20 | Ht 72.0 in | Wt 229.0 lb

## 2019-06-17 DIAGNOSIS — I253 Aneurysm of heart: Secondary | ICD-10-CM | POA: Diagnosis not present

## 2019-06-17 DIAGNOSIS — Z09 Encounter for follow-up examination after completed treatment for conditions other than malignant neoplasm: Secondary | ICD-10-CM

## 2019-06-17 DIAGNOSIS — M792 Neuralgia and neuritis, unspecified: Secondary | ICD-10-CM | POA: Insufficient documentation

## 2019-06-17 NOTE — Progress Notes (Signed)
PCP is Quintella Reichert, MD Referring Provider is Sharman Cheek, MD  Chief Complaint  Patient presents with  . Routine Post Op    2 month f/u, discuss returning to work, HX of  CABG    HPI: 84-month postop office visit after emergency redo  Sternotomy for a large cardiac aneurysm and redo CABG.  The patient continues to have neuropathic pain and weakness in his right arm following right axillary artery exposure for cardiopulmonary bypass.  His chest x-ray today shows clear lung fields with sternal wires intact.  The patient's blood pressure remains elevated related to the stressful factors in his life and transitioning from working his entire life to dealing with his current postop condition.  He has poor hand control of his dominant right arm and hand with pain worse at night controlled with gabapentin. He denies angina or symptoms of CHF Past Medical History:  Diagnosis Date  . Anxiety   . COPD (chronic obstructive pulmonary disease) (HCC)   . Coronary artery disease   . Diabetes mellitus without complication (HCC)   . Duodenal ulcer   . DVT (deep venous thrombosis) (HCC)   . Hx of CABG   . Hypertension     Past Surgical History:  Procedure Laterality Date  . CORONARY ARTERY BYPASS GRAFT    . CORONARY ARTERY BYPASS GRAFT N/A 02/11/2019   Procedure: EMERGENCY REDO CORONARY ARTERY BYPASS GRAFTING (CABG) USING RIGHT GREATER SAPHENOUS VEIN HARVESTED ENDOSCOPICALLY;  Surgeon: Kerin Perna, MD;  Location: [facility] OR;  Service: Open Heart Surgery;  Laterality: N/A;  . TEE WITHOUT CARDIOVERSION N/A 02/11/2019   Procedure: TRANSESOPHAGEAL ECHOCARDIOGRAM (TEE);  Surgeon: Donata Clay, Theron Arista, MD;  Location: [facility] OR;  Service: Open Heart Surgery;  Laterality: N/A;  . THORACIC AORTIC ANEURYSM REPAIR N/A 02/11/2019   Procedure: EMERGENCY REPAIR OF AORTIC PSEUDOANEURYSM;  Surgeon: Kerin Perna, MD;  Location: [facility] OR;  Service: Open Heart Surgery;  Laterality: N/A;    History reviewed. No pertinent  family history.  Social History Social History   Tobacco Use  . Smoking status: Current Every Day Smoker    Types: Cigarettes  . Smokeless tobacco: Never Used  Substance Use Topics  . Alcohol use: Not on file  . Drug use: Not on file    Current Outpatient Medications  Medication Sig Dispense Refill  . acetaminophen (TYLENOL) 325 MG tablet Take 650 mg by mouth 3 (three) times daily as needed for mild pain or fever.    Marland Kitchen albuterol (VENTOLIN HFA) 108 (90 Base) MCG/ACT inhaler Inhale 2 puffs into the lungs every 4 (four) hours as needed for wheezing or shortness of breath.    Marland Kitchen aspirin EC 81 MG EC tablet Take 1 tablet (81 mg total) by mouth daily. 30 tablet 1  . beclomethasone (QVAR) 80 MCG/ACT inhaler Inhale 1 puff into the lungs 2 (two) times daily.    . carboxymethylcellulose 1 % ophthalmic solution Apply 1 drop to eye 4 (four) times daily.    . carvedilol (COREG) 12.5 MG tablet Take 1 tablet (12.5 mg total) by mouth 2 (two) times daily with a meal. 60 tablet 1  . ezetimibe (ZETIA) 10 MG tablet Take 10 mg by mouth daily.    Marland Kitchen gabapentin (NEURONTIN) 300 MG capsule Take 300 mg by mouth at bedtime.    Marland Kitchen losartan (COZAAR) 25 MG tablet Take 1 tablet (25 mg total) by mouth daily. (Patient taking differently: Take 25 mg by mouth in the morning and at bedtime. ) 30  tablet 1  . metFORMIN (GLUCOPHAGE) 500 MG tablet Take 500 mg by mouth 2 (two) times daily with a meal.    . nicotine polacrilex (COMMIT) 4 MG lozenge Take 4 mg by mouth as directed.    . pantoprazole (PROTONIX) 40 MG tablet Take 40 mg by mouth daily.    . rivaroxaban (XARELTO) 20 MG TABS tablet Take 1 tablet (20 mg total) by mouth daily with lunch. 30 tablet 1  . rosuvastatin (CRESTOR) 40 MG tablet Take 40 mg by mouth at bedtime.    Marland Kitchen tiotropium (SPIRIVA) 18 MCG inhalation capsule Place 36 mcg into inhaler and inhale daily.    Marland Kitchen venlafaxine XR (EFFEXOR-XR) 75 MG 24 hr capsule Take 225 mg by mouth daily.    . Vitamin D3 (VITAMIN D)  25 MCG tablet Take 1,000 Units by mouth daily.     No current facility-administered medications for this visit.    Allergies  Allergen Reactions  . Penicillins Anaphylaxis    Review of Systems  Weight stable No fever Difficulty swallowing Surgical incisions all healing well, no clicks or pops and sternal incision  BP (!) 150/100   Pulse 96   Temp 97.8 F (36.6 C) (Skin)   Resp 20   Ht 6' (1.829 m)   Wt 229 lb (103.9 kg)   SpO2 97% Comment: RA  BMI 31.06 kg/m  Physical Exam      Exam    General- alert and comfortable    Neck- no JVD, no cervical adenopathy palpable, no carotid bruit   Lungs- clear without rales, wheezes.  Sternal incision well-healed.   Cor- regular rate and rhythm, no murmur , gallop   Abdomen- soft, non-tender   Extremities - warm, non-tender, minimal edema   Neuro- oriented, appropriate, no focal weakness   Diagnostic Tests: Chest x-ray image today personally viewed with results as noted above  Impression: Patient with slow recovery after emergency repeat cardiac surgery for a large cardiac aneurysm and replacement of his previous right coronary graft. The patient is not able to return to work because of neuropathic pain in his right arm and hand with weakness and because of persistent fatigue and decreased exercise tolerance following surgery. He also has stressful factors in his life which tend to ri raise his blood pressure and increase the risk of bleeding from the repaired aneurysm. Plan: Patient will continue his current medications. He should not lift more than 10 pounds Patient is unable to return to his previous job and should be considered permanently disabled I will see him back in 3 months to reassess his right brachial plexopathy neuropathic pain.  He may need referral to a orthopedic upper extremity surgeon.   Len Childs, MD [facility] [phone]

## 2019-06-17 NOTE — Progress Notes (Signed)
This encounter was created in error - please disregard.

## 2019-07-11 ENCOUNTER — Emergency Department (HOSPITAL_COMMUNITY)
Admission: EM | Admit: 2019-07-11 | Discharge: 2019-07-12 | Disposition: A | Discharge status: Home / Self Care | Payer: 59 | Attending: Emergency Medicine | Admitting: Emergency Medicine

## 2019-07-11 DIAGNOSIS — Z5321 Procedure and treatment not carried out due to patient leaving prior to being seen by health care provider: Secondary | ICD-10-CM | POA: Insufficient documentation

## 2019-07-11 DIAGNOSIS — R319 Hematuria, unspecified: Secondary | ICD-10-CM | POA: Insufficient documentation

## 2019-07-12 ENCOUNTER — Other Ambulatory Visit: Payer: Self-pay

## 2019-07-12 ENCOUNTER — Encounter (HOSPITAL_COMMUNITY): Payer: Self-pay | Admitting: Emergency Medicine

## 2019-07-12 LAB — CBC
HCT: 47.9 % (ref 39.0–52.0)
Hemoglobin: 15.3 g/dL (ref 13.0–17.0)
MCH: 30.1 pg (ref 26.0–34.0)
MCHC: 31.9 g/dL (ref 30.0–36.0)
MCV: 94.1 fL (ref 80.0–100.0)
Platelets: 266 10*3/uL (ref 150–400)
RBC: 5.09 MIL/uL (ref 4.22–5.81)
RDW: 13.4 % (ref 11.5–15.5)
WBC: 8.3 10*3/uL (ref 4.0–10.5)
nRBC: 0 % (ref 0.0–0.2)

## 2019-07-12 LAB — URINALYSIS, ROUTINE W REFLEX MICROSCOPIC

## 2019-07-12 LAB — BASIC METABOLIC PANEL
Anion gap: 11 (ref 5–15)
BUN: 10 mg/dL (ref 8–23)
CO2: 24 mmol/L (ref 22–32)
Calcium: 9.3 mg/dL (ref 8.9–10.3)
Chloride: 104 mmol/L (ref 98–111)
Creatinine, Ser: 0.98 mg/dL (ref 0.61–1.24)
GFR calc Af Amer: >60 mL/min (ref >60)
GFR calc non Af Amer: >60 mL/min (ref >60)
Glucose, Bld: 135 mg/dL — ABNORMAL HIGH (ref 70–99)
Potassium: 4 mmol/L (ref 3.5–5.1)
Sodium: 139 mmol/L (ref 135–145)

## 2019-07-12 NOTE — ED Triage Notes (Signed)
Pt presents to ED POV. Pt c/o hematuria beginning 0400. Pt states he's gone 4x today and all times have been blood. Pt does not have any other complaints

## 2019-07-12 NOTE — ED Notes (Addendum)
Pt says he can't wait and will call PCP in the am. Pt advised to stay

## 2019-09-07 ENCOUNTER — Other Ambulatory Visit: Payer: Self-pay

## 2019-09-07 ENCOUNTER — Emergency Department (HOSPITAL_COMMUNITY)
Admission: EM | Admit: 2019-09-07 | Discharge: 2019-09-07 | Disposition: A | Discharge status: Home / Self Care | Payer: Self-pay | Attending: Emergency Medicine | Admitting: Emergency Medicine

## 2019-09-07 ENCOUNTER — Encounter (HOSPITAL_COMMUNITY): Payer: Self-pay | Admitting: Emergency Medicine

## 2019-09-07 DIAGNOSIS — Z79899 Other long term (current) drug therapy: Secondary | ICD-10-CM | POA: Insufficient documentation

## 2019-09-07 DIAGNOSIS — R040 Epistaxis: Secondary | ICD-10-CM | POA: Insufficient documentation

## 2019-09-07 DIAGNOSIS — I119 Hypertensive heart disease without heart failure: Secondary | ICD-10-CM | POA: Insufficient documentation

## 2019-09-07 DIAGNOSIS — F17210 Nicotine dependence, cigarettes, uncomplicated: Secondary | ICD-10-CM | POA: Insufficient documentation

## 2019-09-07 DIAGNOSIS — Z7982 Long term (current) use of aspirin: Secondary | ICD-10-CM | POA: Insufficient documentation

## 2019-09-07 DIAGNOSIS — Z951 Presence of aortocoronary bypass graft: Secondary | ICD-10-CM | POA: Insufficient documentation

## 2019-09-07 DIAGNOSIS — E119 Type 2 diabetes mellitus without complications: Secondary | ICD-10-CM | POA: Insufficient documentation

## 2019-09-07 DIAGNOSIS — J449 Chronic obstructive pulmonary disease, unspecified: Secondary | ICD-10-CM | POA: Insufficient documentation

## 2019-09-07 DIAGNOSIS — Z7984 Long term (current) use of oral hypoglycemic drugs: Secondary | ICD-10-CM | POA: Insufficient documentation

## 2019-09-07 DIAGNOSIS — Z880 Allergy status to penicillin: Secondary | ICD-10-CM | POA: Insufficient documentation

## 2019-09-07 LAB — CBC WITH DIFFERENTIAL/PLATELET
Abs Immature Granulocytes: 0.29 10*3/uL — ABNORMAL HIGH (ref 0.00–0.07)
Basophils Absolute: 0.1 10*3/uL (ref 0.0–0.1)
Basophils Relative: 1 %
Eosinophils Absolute: 0 10*3/uL (ref 0.0–0.5)
Eosinophils Relative: 0 %
HCT: 38.3 % — ABNORMAL LOW (ref 39.0–52.0)
Hemoglobin: 12.2 g/dL — ABNORMAL LOW (ref 13.0–17.0)
Immature Granulocytes: 2 %
Lymphocytes Relative: 11 %
Lymphs Abs: 1.5 10*3/uL (ref 0.7–4.0)
MCH: 30 pg (ref 26.0–34.0)
MCHC: 31.9 g/dL (ref 30.0–36.0)
MCV: 94.1 fL (ref 80.0–100.0)
Monocytes Absolute: 1 10*3/uL (ref 0.1–1.0)
Monocytes Relative: 7 %
Neutro Abs: 11.4 10*3/uL — ABNORMAL HIGH (ref 1.7–7.7)
Neutrophils Relative %: 79 %
Platelets: 353 10*3/uL (ref 150–400)
RBC: 4.07 MIL/uL — ABNORMAL LOW (ref 4.22–5.81)
RDW: 13.1 % (ref 11.5–15.5)
WBC: 14.3 10*3/uL — ABNORMAL HIGH (ref 4.0–10.5)
nRBC: 0 % (ref 0.0–0.2)

## 2019-09-07 LAB — BASIC METABOLIC PANEL
Anion gap: 14 (ref 5–15)
BUN: 31 mg/dL — ABNORMAL HIGH (ref 8–23)
CO2: 25 mmol/L (ref 22–32)
Calcium: 9.3 mg/dL (ref 8.9–10.3)
Chloride: 104 mmol/L (ref 98–111)
Creatinine, Ser: 1.13 mg/dL (ref 0.61–1.24)
GFR calc Af Amer: >60 mL/min (ref >60)
GFR calc non Af Amer: >60 mL/min (ref >60)
Glucose, Bld: 128 mg/dL — ABNORMAL HIGH (ref 70–99)
Potassium: 4.3 mmol/L (ref 3.5–5.1)
Sodium: 143 mmol/L (ref 135–145)

## 2019-09-07 LAB — CBC
HCT: 39.3 % (ref 39.0–52.0)
Hemoglobin: 12.4 g/dL — ABNORMAL LOW (ref 13.0–17.0)
MCH: 30 pg (ref 26.0–34.0)
MCHC: 31.6 g/dL (ref 30.0–36.0)
MCV: 94.9 fL (ref 80.0–100.0)
Platelets: 334 10*3/uL (ref 150–400)
RBC: 4.14 MIL/uL — ABNORMAL LOW (ref 4.22–5.81)
RDW: 13.1 % (ref 11.5–15.5)
WBC: 15.1 10*3/uL — ABNORMAL HIGH (ref 4.0–10.5)
nRBC: 0 % (ref 0.0–0.2)

## 2019-09-07 LAB — PROTIME-INR
INR: 1.1 (ref 0.8–1.2)
Prothrombin Time: 14 seconds (ref 11.4–15.2)

## 2019-09-07 MED ORDER — OXYMETAZOLINE HCL 0.05 % NA SOLN
1.0000 | Freq: Once | NASAL | Status: AC
  Administered 2019-09-07: 1 via NASAL
  Filled 2019-09-07: qty 30

## 2019-09-07 MED ORDER — LACTATED RINGERS IV BOLUS: 1000.0000 mL | Freq: Once | INTRAVENOUS | Status: DC

## 2019-09-07 MED ORDER — TRANEXAMIC ACID FOR EPISTAXIS
500.0000 mg | Freq: Once | TOPICAL | Status: DC
  Filled 2019-09-07: qty 10

## 2019-09-07 NOTE — ED Provider Notes (Signed)
[facility] EMERGENCY DEPARTMENT Provider Note   CSN: [id number] Arrival date & time: 09/07/19  1716     History Chief Complaint  Patient presents with  . Epistaxis    Evan Preston is a 64 y.o. male.  The history is provided by the patient.  Epistaxis Location:  Bilateral Severity:  Severe Duration:  6 hours Timing:  Constant Progression:  Unchanged Chronicity:  New Context: anticoagulants   Context: not bleeding disorder, not CPAP, not nose picking and not thrombocytopenia   Relieved by:  Nothing Worsened by:  Nothing Ineffective treatments:  Applying pressure Associated symptoms: blood in oropharynx and cough   Associated symptoms: no fever and no sore throat   Risk factors: no frequent nosebleeds        Past Medical History:  Diagnosis Date  . Anxiety   . COPD (chronic obstructive pulmonary disease) (HCC)   . Coronary artery disease   . Diabetes mellitus without complication (HCC)   . Duodenal ulcer   . DVT (deep venous thrombosis) (HCC)   . Hx of CABG   . Hypertension     Patient Active Problem List   Diagnosis Date Noted  . Neuropathic pain of hand, right 06/17/2019  . 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 Postop check 03/18/2019  . 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 Essential hypertension 02/11/2019  . COPD (chronic obstructive pulmonary disease) (HCC) 02/11/2019  . Duodenal ulcer 02/11/2019  . Tobacco abuse 02/11/2019  . Generalized anxiety disorder 02/11/2019  . 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 DVT (deep venous thrombosis) (HCC) 02/11/2019    Past Surgical History:  Procedure Laterality Date  . CORONARY ARTERY BYPASS GRAFT    . CORONARY ARTERY BYPASS GRAFT N/A 02/11/2019   Procedure: EMERGENCY REDO CORONARY ARTERY BYPASS GRAFTING (CABG) USING RIGHT GREATER SAPHENOUS VEIN HARVESTED ENDOSCOPICALLY;  Surgeon: Kerin Perna, MD;  Location: [facility] OR;  Service: Open Heart Surgery;  Laterality: N/A;  . TEE WITHOUT CARDIOVERSION  N/A 02/11/2019   Procedure: TRANSESOPHAGEAL ECHOCARDIOGRAM (TEE);  Surgeon: Donata Clay, Theron Arista, MD;  Location: [facility] OR;  Service: Open Heart Surgery;  Laterality: N/A;  . THORACIC AORTIC ANEURYSM REPAIR N/A 02/11/2019   Procedure: EMERGENCY REPAIR OF AORTIC PSEUDOANEURYSM;  Surgeon: Kerin Perna, MD;  Location: [facility] OR;  Service: Open Heart Surgery;  Laterality: N/A;       No family history on file.  Social History   Tobacco Use  . Smoking status: Current Every Day Smoker    Types: Cigarettes  . Smokeless tobacco: Never Used  Vaping Use  . Vaping Use: Never used  Substance Use Topics  . Alcohol use: Not Currently  . Drug use: Not Currently    Home Medications Prior to Admission medications   Medication Sig Start Date End Date Taking? Authorizing Provider  acetaminophen (TYLENOL) 325 MG tablet Take 650 mg by mouth 3 (three) times daily as needed for mild pain or fever.    [provider]  albuterol (VENTOLIN HFA) 108 (90 Base) MCG/ACT inhaler Inhale 2 puffs into the lungs every 4 (four) hours as needed for wheezing or shortness of breath.    [provider]  aspirin EC 81 MG EC tablet Take 1 tablet (81 mg total) by mouth daily. 02/19/19   Sharlene Dory, PA-C  beclomethasone (QVAR) 80 MCG/ACT inhaler Inhale 1 puff into the lungs 2 (two) times daily.    [provider]  carboxymethylcellulose 1 % ophthalmic solution Apply 1 drop to eye 4 (four) times daily.  [provider]  carvedilol (COREG) 12.5 MG tablet Take 1 tablet (12.5 mg total) by mouth 2 (two) times daily with a meal. 02/19/19   Sharlene Dory, PA-C  ezetimibe (ZETIA) 10 MG tablet Take 10 mg by mouth daily.    [provider]  gabapentin (NEURONTIN) 300 MG capsule Take 300 mg by mouth at bedtime.    [provider]  losartan (COZAAR) 25 MG tablet Take 1 tablet (25 mg total) by mouth daily. Patient taking differently: Take 25 mg by mouth in the morning and at bedtime.   02/19/19   Sharlene Dory, PA-C  metFORMIN (GLUCOPHAGE) 500 MG tablet Take 500 mg by mouth 2 (two) times daily with a meal.    [provider]  nicotine polacrilex (COMMIT) 4 MG lozenge Take 4 mg by mouth as directed.    [provider]  pantoprazole (PROTONIX) 40 MG tablet Take 40 mg by mouth daily.    [provider]  rivaroxaban (XARELTO) 20 MG TABS tablet Take 1 tablet (20 mg total) by mouth daily with lunch. 02/20/19   Sharlene Dory, PA-C  rosuvastatin (CRESTOR) 40 MG tablet Take 40 mg by mouth at bedtime.    [provider]  tiotropium (SPIRIVA) 18 MCG inhalation capsule Place 36 mcg into inhaler and inhale daily.    [provider]  venlafaxine XR (EFFEXOR-XR) 75 MG 24 hr capsule Take 225 mg by mouth daily.    [provider]  Vitamin D3 (VITAMIN D) 25 MCG tablet Take 1,000 Units by mouth daily.    [provider]    Allergies    Penicillins  Review of Systems   Review of Systems  Constitutional: Negative for chills and fever.  HENT: Positive for nosebleeds. Negative for ear pain and sore throat.   Eyes: Negative for pain and visual disturbance.  Respiratory: Positive for cough. Negative for shortness of breath.   Cardiovascular: Negative for chest pain and palpitations.  Gastrointestinal: Negative for abdominal pain and vomiting.  Genitourinary: Negative for dysuria and hematuria.  Musculoskeletal: Negative for arthralgias and back pain.  Skin: Negative for color change and rash.  Neurological: Negative for seizures and syncope.  All other systems reviewed and are negative.   Physical Exam Updated Vital Signs BP 112/71 (BP Location: Right Arm)   Pulse (!) 104   Temp 98.3 F (36.8 C) (Oral)   Resp 18   Ht 6' (1.829 m)   Wt 103 kg   SpO2 98%   BMI 30.80 kg/m   Physical Exam Vitals and nursing note reviewed.  Constitutional:      Appearance: He is well-developed.  HENT:     Head: Normocephalic and  atraumatic.     Nose:     Comments: Large hole in septum w/ clots and dried blood throughout bl nares.  No obvious sources of heavy bleeding.      Mouth/Throat:     Comments: Dried blood on tongue and coating entire mouth.   Eyes:     Conjunctiva/sclera: Conjunctivae normal.  Cardiovascular:     Rate and Rhythm: Normal rate and regular rhythm.     Heart sounds: No murmur heard.   Pulmonary:     Effort: Pulmonary effort is normal. No respiratory distress.     Breath sounds: Normal breath sounds.  Abdominal:     Palpations: Abdomen is soft.     Tenderness: There is no abdominal tenderness.  Musculoskeletal:     Cervical back: Neck supple.  Skin:    General: Skin is warm and dry.  Neurological:     Mental Status: He is alert.     ED Results / Procedures / Treatments   Labs (all labs ordered are listed, but only abnormal results are displayed) Labs Reviewed  CBC - Abnormal; Notable for the following components:      Result Value   WBC 15.1 (*)    RBC 4.14 (*)    Hemoglobin 12.4 (*)    All other components within normal limits  BASIC METABOLIC PANEL - Abnormal; Notable for the following components:   Glucose, Bld 128 (*)    BUN 31 (*)    All other components within normal limits  CBC WITH DIFFERENTIAL/PLATELET - Abnormal; Notable for the following components:   WBC 14.3 (*)    RBC 4.07 (*)    Hemoglobin 12.2 (*)    HCT 38.3 (*)    Neutro Abs 11.4 (*)    Abs Immature Granulocytes 0.29 (*)    All other components within normal limits  PROTIME-INR    EKG None  Radiology No results found.  Procedures .Epistaxis Management  Date/Time: 09/07/2019 6:32 PM Performed by: Rickey Primus, MD Authorized by: Virgina Norfolk, DO   Consent:    Consent obtained:  Verbal   Consent given by:  Patient   Risks discussed:  Bleeding   Alternatives discussed:  No treatment Procedure details:    Treatment site:  L posterior and R posterior   Repair method: Afrin and silver  nitrate.   Treatment complexity:  Limited   Treatment episode: initial   Post-procedure details:    Assessment:  Bleeding stopped   Patient tolerance of procedure:  Tolerated well, no immediate complications   (including critical care time)  Medications Ordered in ED Medications  tranexamic acid (CYKLOKAPRON) 1000 MG/10ML topical solution 500 mg (0 mg Topical Hold 09/07/19 2136)  lactated ringers bolus 1,000 mL (has no administration in time range)  oxymetazoline (AFRIN) 0.05 % nasal spray 1 spray (1 spray Each Nare Given 09/07/19 1816)    ED Course  I have reviewed the triage vital signs and the nursing notes.  Pertinent labs & imaging results that were available during my care of the patient were reviewed by me and considered in my medical decision making (see chart for details).    MDM Rules/Calculators/A&P                         64 year old male with history of DVTs currently on Xarelto presents with epistaxis.  Patient states that his nosebleed started at approx 0600 this am.  Patient is unclear which nostrol he is bleeding out of however he can feel it in the back of his throat.  Endorses lightheadedness but denies syncope, chest pain, shortness of breath, other bleeding disorders, other episodes of bleeding.  Denies any trauma or stimulation to the nose.  Patient does state that he has a large hole in the septum.  Afebrile.  Tachycardic 130s.  Remainder vital signs stable.  Exam as above.  There is large amount of blood in the bilateral nares as well as patient's mouth.  Patient blew his nose and upon direct visualization no obvious sources of heavy bleeding in the back of the nose.  Patient does not have septum so source of bleeding is most likely posterior.  Patient was given Afrin with improvement of his nosebleed however he still states that he felt blood dripping down the  back of his throat.  Silver nitrate was applied to the posterior nares bilaterally with improvement of  patient symptoms.  Upon reevaluation patient stated that he did not have any current sensation of blood running down the back of his mouth or his bilateral nares.  Given patient's prolonged amount of bleeding, tachycardia will check basic labs and reassess patient.  Upon reevaluation pt continued to deny any recurrence of epistaxis. CBC 12.4 (15.3 one month ago).  Bmp and INR unremarkable.  Repeat HR low 100s.  Pt stated that when he stood up quickly to use the bathroom he felt dizzy and felt like he was going to pass out.  Pt was seated and was able to tolerate PO intake at that time w/o difficulty.  Repeat Hgb 12.2.  Upon reeval pt stated that he felt comfortable going home.  Given that pt has had approx 4h of obs w/o recurrence of sx and is tolerating PO intake feel that pt is stable for discharge at this time.  Discussed strict ED return precautions w/ the pt and instructed him to avoid blowing or picking his nose as well as to hold his Xarelto until he contacts his PCP.  Also gave pt the number for ENT and instructed him to f/u if needed.  Pt voiced agreement and understanding of the overall plan.  Pt discharged in stable condition w/o further events.  Final Clinical Impression(s) / ED Diagnoses Final diagnoses:  Epistaxis    Rx / DC Orders ED Discharge Orders    None       Rickey Primus, MD 09/07/19 2226    Virgina Norfolk, DO 09/07/19 2243

## 2019-09-07 NOTE — Discharge Instructions (Signed)
Please avoid picking your nose or other stimulation to the nose for the next few days if you have another episode of heavy bleeding please return to emerge department.  Please hold your Xarelto for the next 2 to 3 days and follow-up with primary care provider before restarting.  Please call ENT if you have recurrent symptoms.  Please use of hydrated and well fed over the next few days.

## 2019-09-07 NOTE — ED Provider Notes (Signed)
I have personally seen and examined the patient. I have reviewed the documentation on PMH/FH/Soc Hx. I have discussed the plan of care with the resident and patient.  I have reviewed and agree with the resident's documentation. Please see associated encounter note.  Briefly, the patient is a 64 y.o. male here with nosebleed.  Patient tachycardic upon arrival but has improved.  Otherwise normal vitals.  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  States that he has been bleeding for the last several hours.  Last took Xarelto at 4 AM.  He is not sure where the bleeding is coming from as he has had no surgery in the past and actually does not have a nasal septum.  He has been coughing up large amount of clots.  Upon evaluation by myself and the resident we were able to use silver nitrate to cauterize some possible areas of bleeding.  Patient was hemostatic after that.  He has been observed for several hours without any further bleeding.  However given the length of bleeding concern for possible acute blood loss anemia.  Hemoglobin upon arrival was 12.4.  However several months ago it looks like his baseline was around 15.  He is symptomatic when he walks as he feels lightheaded.  We will recheck CBC at 4 hours to see if there is any further delta change.  If hemoglobin is stable and patient is less symptomatic suspect that he can be discharged to home and we will have him hold Xarelto.  We will have him follow-up with ENT.  Has no longer had any bleeding after silver nitrate.  This chart was dictated using voice recognition software.  Despite best efforts to proofread,  errors can occur which can change the documentation meaning.     EKG Interpretation None         Virgina Norfolk, DO 09/07/19 2155

## 2019-09-07 NOTE — ED Notes (Signed)
During triage pt became diaphoretic, clammy stating his legs were tingling.  HR 130, pt positioned supine on recliner.

## 2019-09-07 NOTE — ED Triage Notes (Signed)
Pt transported from home with nosebleed, pt states he sneezed this am dislodging blood clot. Pt states he has since cough up more clots. Pt does take Xaralto. VSS. Pt did like he was going to pass out during transport, VS remained same.

## 2019-09-15 ENCOUNTER — Other Ambulatory Visit: Payer: Self-pay | Admitting: Cardiothoracic Surgery

## 2019-09-15 DIAGNOSIS — I253 Aneurysm of heart: Secondary | ICD-10-CM

## 2019-09-16 ENCOUNTER — Encounter: Payer: Self-pay | Admitting: Cardiothoracic Surgery

## 2019-10-07 ENCOUNTER — Encounter: Payer: Self-pay | Admitting: Cardiothoracic Surgery

## 2020-02-12 DIAGNOSIS — Z736 Limitation of activities due to disability: Secondary | ICD-10-CM

## 2020-09-12 DIAGNOSIS — Z736 Limitation of activities due to disability: Secondary | ICD-10-CM

## 2021-08-28 IMAGING — DX DG CHEST 1V PORT
1 series · 1 of 1 positions shown · non-contrast
Comparison: None.

CLINICAL DATA: Postop aneurysm repair

EXAM:
PORTABLE CHEST 1 VIEW

[chest ap]
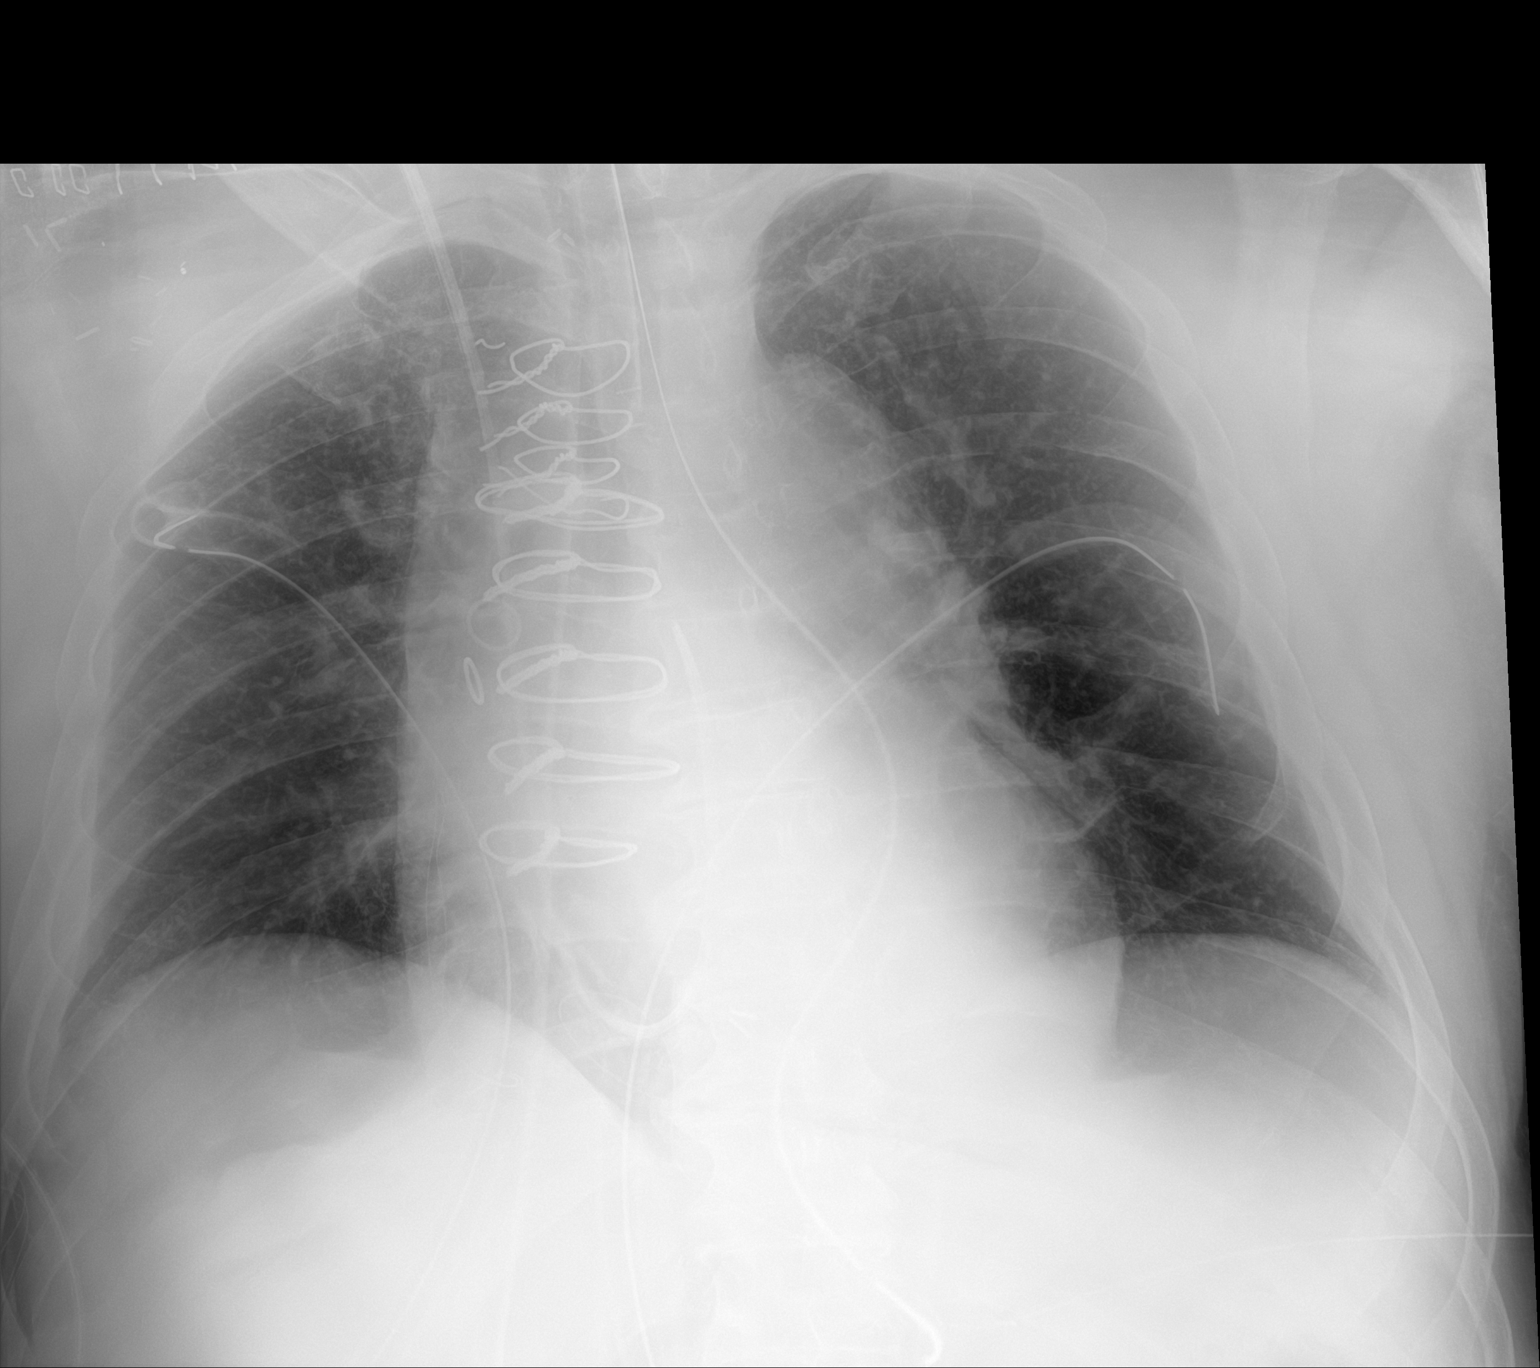

[1 of 1 positions shown; findings below may reference images not displayed]

FINDINGS: Status post median sternotomy. There bilateral chest tubes with tips
and side ports projecting over the right upper lobe and lingula.
Endotracheal tube tip is approximately 2 cm above the inferior
margin of the carina. Retraction by 1.5 cm would place it at the
level of the clavicular heads. Enteric tube side port projects
within the stomach. Pulmonary arterial catheter tip projects over
the main pulmonary artery. There is mild cardiomegaly. The lungs are
clear.
IMPRESSION: No focal airspace disease.

## 2021-09-02 IMAGING — DX DG CHEST 1V PORT
1 series · 1 of 1 positions shown · non-contrast
Comparison: Radiograph 02/16/2019

CLINICAL DATA: History of CABG

EXAM:
PORTABLE CHEST 1 VIEW

[chest ap]
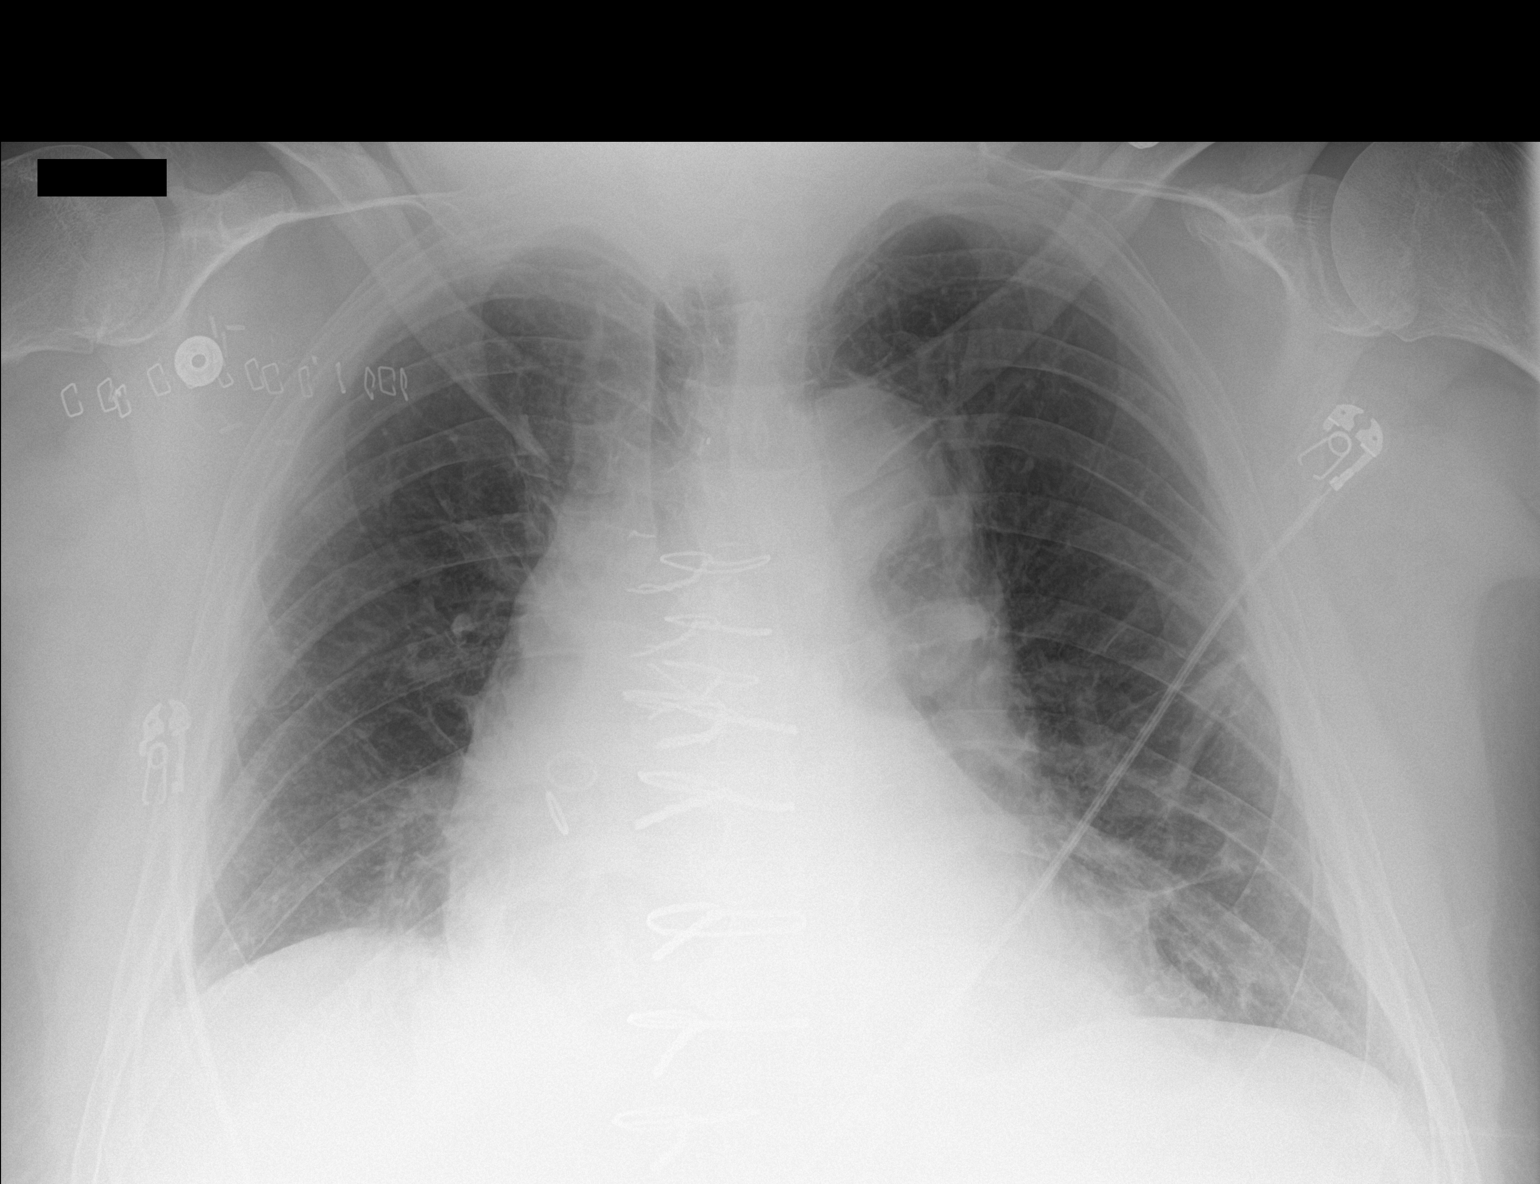

[1 of 1 positions shown; findings below may reference images not displayed]

FINDINGS: Postsurgical changes related to prior CABG including intact and
aligned sternotomy wires and multiple surgical clips projecting over
the mediastinum. Surgical clips present in the right axilla as well
as overlying skin staples. Removal of the previously seen right IJ
catheter sheath.

Stable cardiomediastinal contours accounting for differences in
technique. There are few streaky opacities in the lung bases
compatible with some residual subsegmental atelectatic change.
Improving edematous features with diminished vascular congestion. No
visible pneumothorax or effusion. No acute osseous or soft tissue
abnormality.
IMPRESSION: Some residual atelectatic changes. With improving edema and
diminished vascular congestion.

Removal of a right IJ catheter sheath.
